# Patient Record
Sex: Male | Born: 1945 | Race: White | Hispanic: No | Marital: Married | Smoking: Former smoker
Health system: Southern US, Community
[De-identification: ages and names within clinical notes are randomized; demographics above are authoritative.]

## PROBLEM LIST (undated history)

## (undated) DIAGNOSIS — I4891 Unspecified atrial fibrillation: Secondary | ICD-10-CM

## (undated) DIAGNOSIS — Z7901 Long term (current) use of anticoagulants: Secondary | ICD-10-CM

## (undated) DIAGNOSIS — N529 Male erectile dysfunction, unspecified: Secondary | ICD-10-CM

## (undated) HISTORY — DX: Long term (current) use of anticoagulants: Z79.01

## (undated) HISTORY — DX: Unspecified atrial fibrillation: I48.91

## (undated) HISTORY — DX: Male erectile dysfunction, unspecified: N52.9

---

## 1984-08-18 HISTORY — PX: VASECTOMY REVERSAL: SHX243

## 1984-08-18 HISTORY — PX: CIRCUMCISION REVISION: SHX1347

## 1997-11-28 ENCOUNTER — Other Ambulatory Visit: Admission: RE | Admit: 1997-11-28 | Discharge: 1997-11-28 | Payer: Self-pay | Admitting: Family Medicine

## 2001-12-10 ENCOUNTER — Ambulatory Visit (HOSPITAL_COMMUNITY): Admission: RE | Admit: 2001-12-10 | Discharge: 2001-12-10 | Payer: Self-pay | Admitting: *Deleted

## 2004-02-28 ENCOUNTER — Emergency Department (HOSPITAL_COMMUNITY): Admission: EM | Admit: 2004-02-28 | Discharge: 2004-02-28 | Payer: Self-pay | Admitting: Emergency Medicine

## 2004-07-24 HISTORY — PX: US ECHOCARDIOGRAPHY: HXRAD669

## 2004-10-08 ENCOUNTER — Ambulatory Visit (HOSPITAL_COMMUNITY): Admission: RE | Admit: 2004-10-08 | Discharge: 2004-10-08 | Payer: Self-pay | Admitting: *Deleted

## 2005-10-15 HISTORY — PX: CARDIOVASCULAR STRESS TEST: SHX262

## 2005-12-12 IMAGING — CR DG CHEST 2V
2 series · 2 of 2 positions shown · non-contrast
Comparison: none

CLINICAL DATA: Preprocedure respiratory film. 
 CHEST - 2 VIEW, 10/08/04:

[view not recorded (1 of 2)]
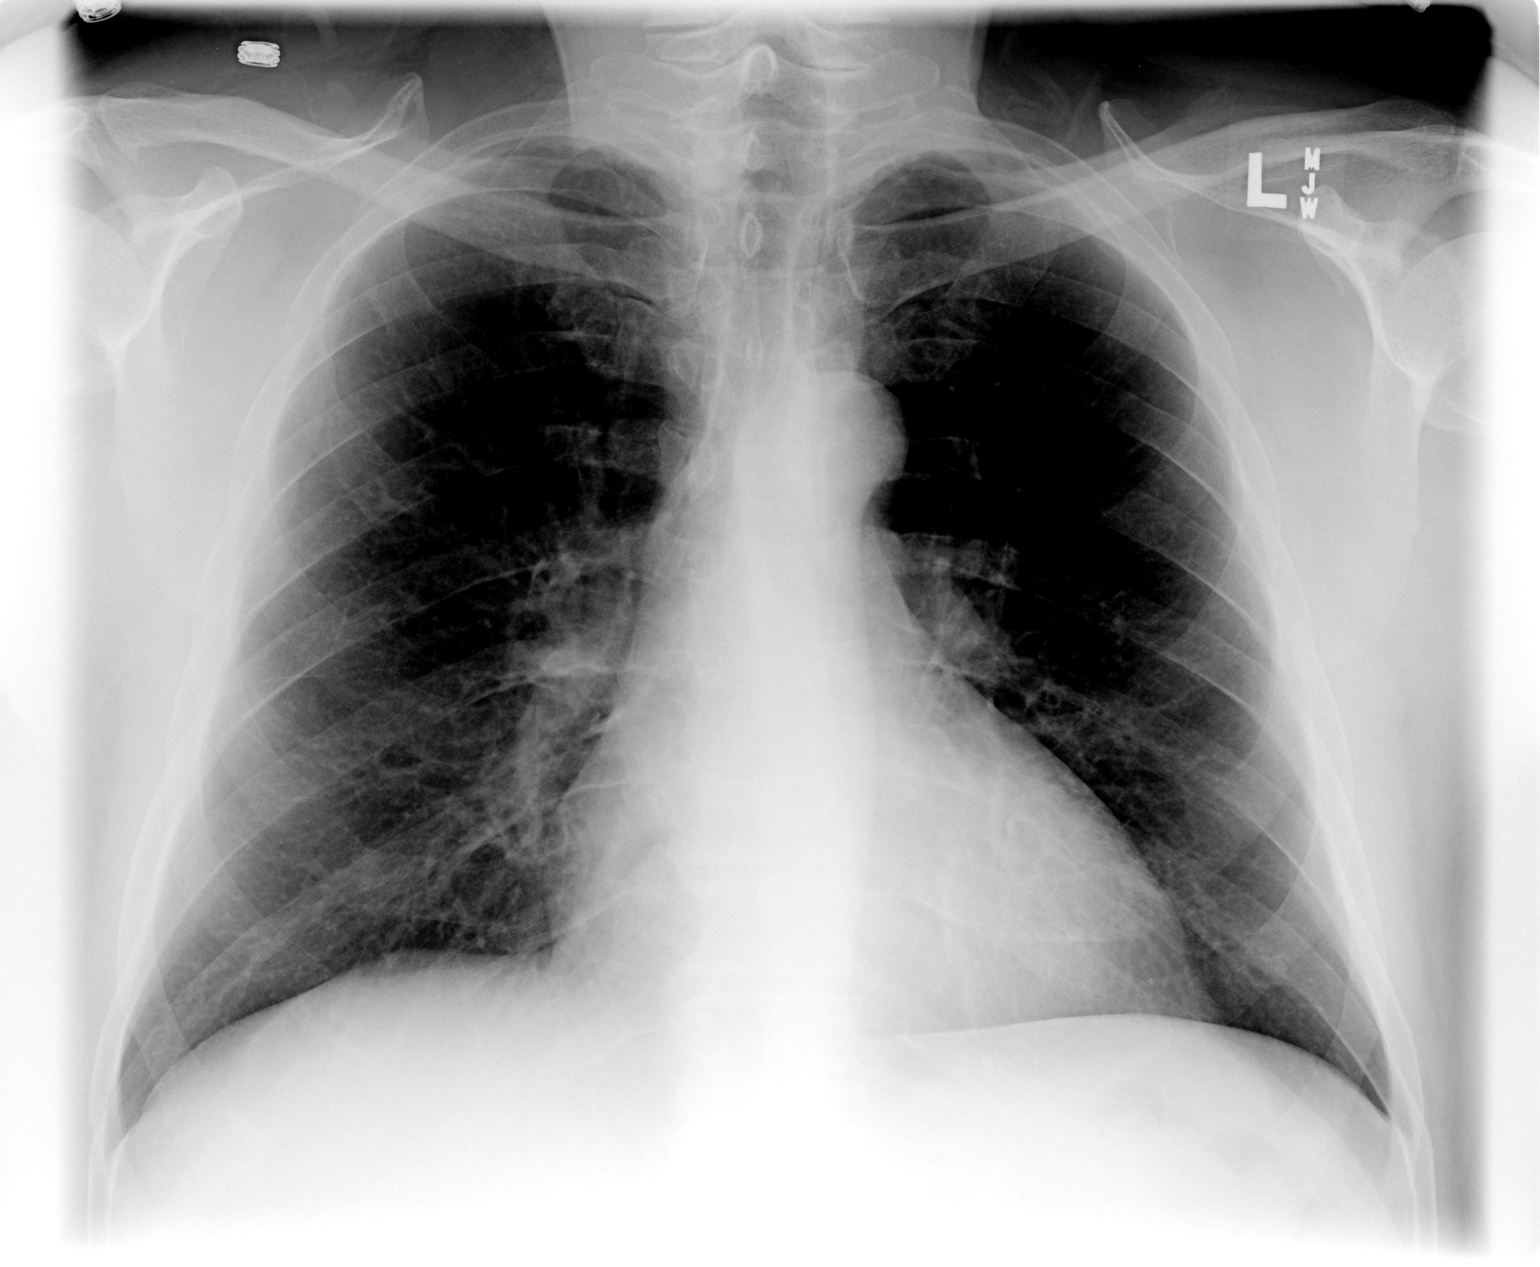

[view not recorded (2 of 2)]
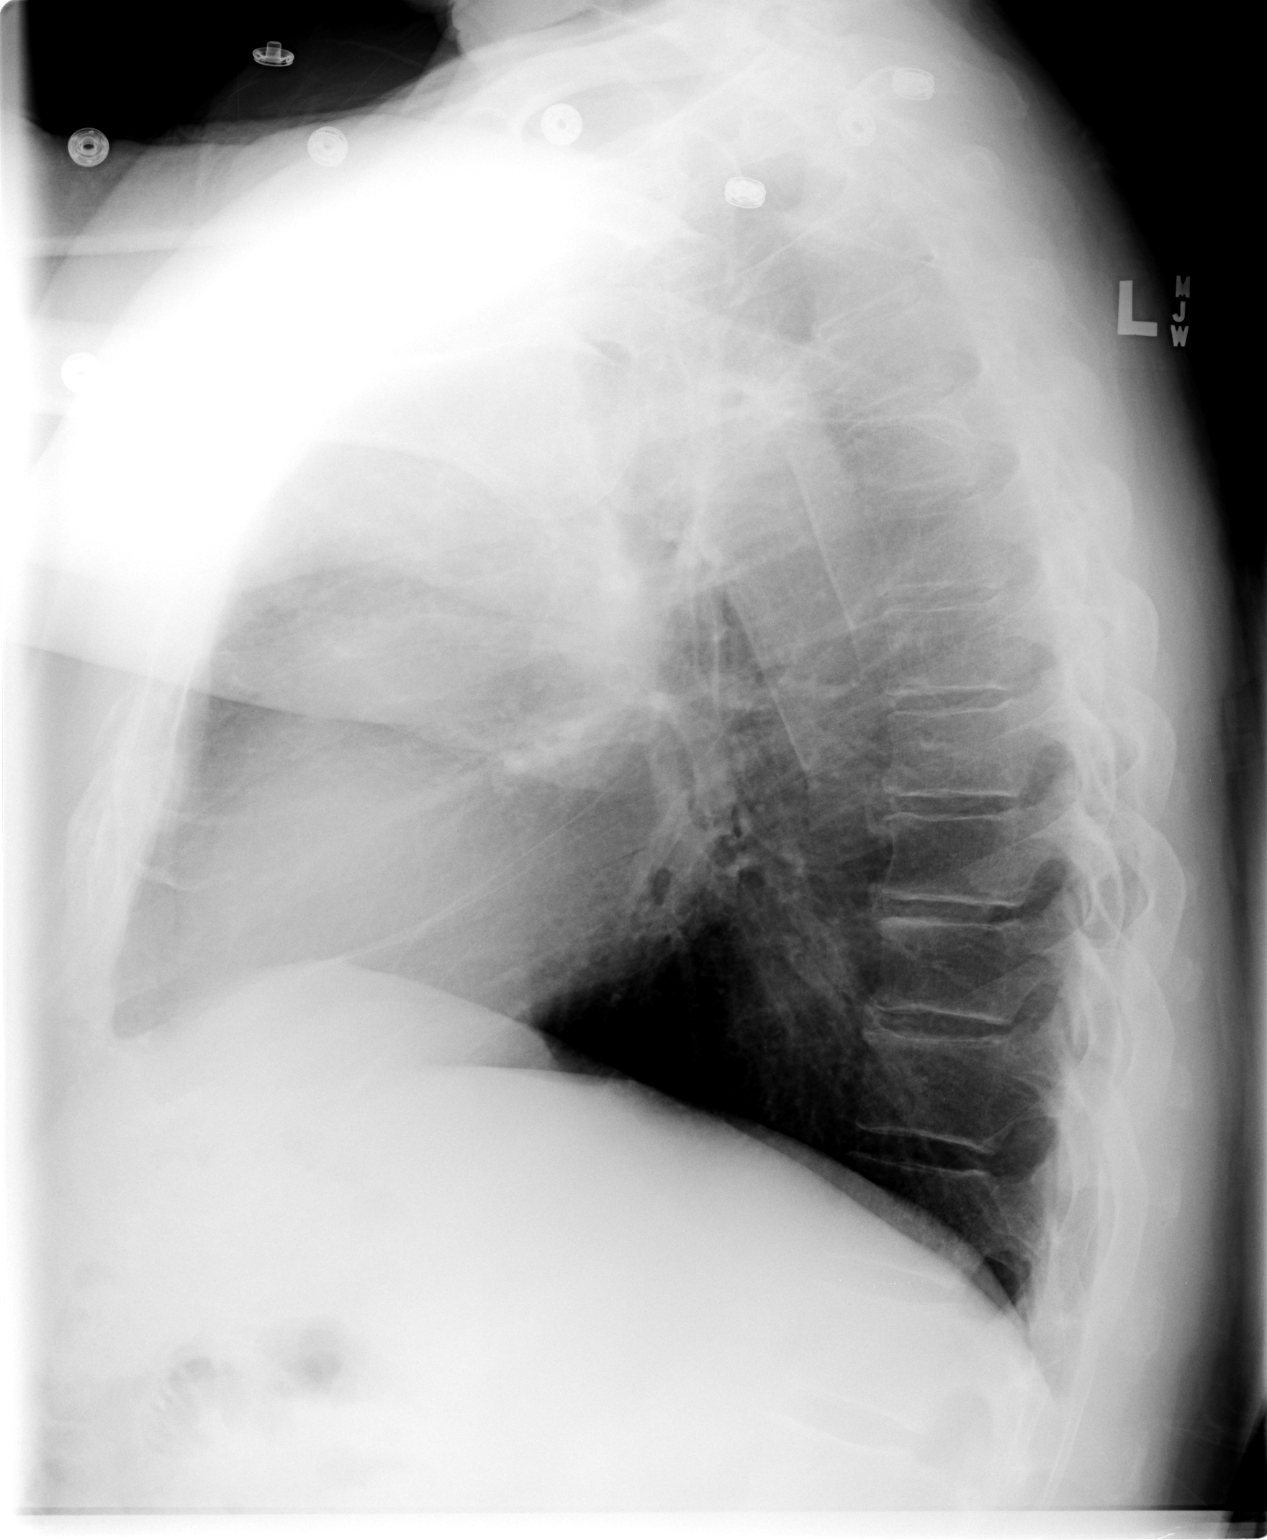

[2 of 2 positions shown; findings below may reference images not displayed]

FINDINGS: Lungs are clear.  Cardiomegaly.  No pleural effusion.  No focal bony abnormality.
IMPRESSION: Cardiomegaly without acute disease.

## 2006-01-27 ENCOUNTER — Ambulatory Visit (HOSPITAL_COMMUNITY): Admission: AD | Admit: 2006-01-27 | Discharge: 2006-01-27 | Payer: Self-pay | Admitting: Cardiovascular Disease

## 2010-03-25 ENCOUNTER — Ambulatory Visit: Payer: Self-pay | Admitting: Cardiovascular Disease

## 2010-04-25 ENCOUNTER — Ambulatory Visit: Payer: Self-pay | Admitting: Cardiovascular Disease

## 2010-05-21 ENCOUNTER — Ambulatory Visit: Payer: Self-pay | Admitting: Cardiovascular Disease

## 2010-06-25 ENCOUNTER — Ambulatory Visit: Payer: Self-pay | Admitting: Cardiovascular Disease

## 2010-07-26 ENCOUNTER — Ambulatory Visit: Payer: Self-pay | Admitting: Cardiovascular Disease

## 2010-09-02 ENCOUNTER — Ambulatory Visit: Payer: Self-pay | Admitting: Cardiology

## 2010-09-25 ENCOUNTER — Encounter (INDEPENDENT_AMBULATORY_CARE_PROVIDER_SITE_OTHER): Payer: BC Managed Care – PPO

## 2010-09-25 DIAGNOSIS — I4891 Unspecified atrial fibrillation: Secondary | ICD-10-CM

## 2010-09-25 DIAGNOSIS — Z7901 Long term (current) use of anticoagulants: Secondary | ICD-10-CM

## 2010-10-24 ENCOUNTER — Encounter (INDEPENDENT_AMBULATORY_CARE_PROVIDER_SITE_OTHER): Payer: BC Managed Care – PPO

## 2010-10-24 DIAGNOSIS — I4891 Unspecified atrial fibrillation: Secondary | ICD-10-CM

## 2010-10-24 DIAGNOSIS — Z7901 Long term (current) use of anticoagulants: Secondary | ICD-10-CM

## 2010-11-21 ENCOUNTER — Ambulatory Visit (INDEPENDENT_AMBULATORY_CARE_PROVIDER_SITE_OTHER): Payer: BC Managed Care – PPO | Admitting: *Deleted

## 2010-11-21 DIAGNOSIS — I4891 Unspecified atrial fibrillation: Secondary | ICD-10-CM

## 2010-11-25 ENCOUNTER — Encounter: Payer: BC Managed Care – PPO | Admitting: *Deleted

## 2010-12-19 ENCOUNTER — Encounter: Payer: BC Managed Care – PPO | Admitting: *Deleted

## 2010-12-20 ENCOUNTER — Telehealth: Payer: Self-pay | Admitting: *Deleted

## 2010-12-20 NOTE — Telephone Encounter (Signed)
Sent letter for missed coumadin app for 12/19/10.Alfonso Ramus RN

## 2010-12-30 ENCOUNTER — Other Ambulatory Visit: Payer: Self-pay | Admitting: *Deleted

## 2010-12-30 MED ORDER — WARFARIN SODIUM 5 MG PO TABS: ORAL_TABLET | ORAL | Status: DC

## 2010-12-31 ENCOUNTER — Ambulatory Visit (INDEPENDENT_AMBULATORY_CARE_PROVIDER_SITE_OTHER): Payer: BC Managed Care – PPO | Admitting: *Deleted

## 2010-12-31 DIAGNOSIS — I4891 Unspecified atrial fibrillation: Secondary | ICD-10-CM

## 2010-12-31 LAB — POCT INR: INR: 2.9

## 2011-01-28 ENCOUNTER — Ambulatory Visit (INDEPENDENT_AMBULATORY_CARE_PROVIDER_SITE_OTHER): Payer: BC Managed Care – PPO | Admitting: *Deleted

## 2011-01-28 DIAGNOSIS — I4891 Unspecified atrial fibrillation: Secondary | ICD-10-CM

## 2011-02-25 ENCOUNTER — Ambulatory Visit (INDEPENDENT_AMBULATORY_CARE_PROVIDER_SITE_OTHER): Payer: BC Managed Care – PPO | Admitting: *Deleted

## 2011-02-25 DIAGNOSIS — I4891 Unspecified atrial fibrillation: Secondary | ICD-10-CM

## 2011-03-03 ENCOUNTER — Other Ambulatory Visit: Payer: Self-pay | Admitting: *Deleted

## 2011-03-03 MED ORDER — METOPROLOL TARTRATE 50 MG PO TABS: 50.0000 mg | ORAL_TABLET | Freq: Two times a day (BID) | ORAL | Status: DC

## 2011-03-03 NOTE — Telephone Encounter (Signed)
Fax received from pharmacy. Refill completed. Jodette Mallory Schaad RN  

## 2011-03-13 ENCOUNTER — Other Ambulatory Visit: Payer: Self-pay | Admitting: *Deleted

## 2011-03-13 MED ORDER — VARDENAFIL HCL 10 MG PO TABS: 10.0000 mg | ORAL_TABLET | Freq: Every day | ORAL | Status: AC | PRN

## 2011-03-13 NOTE — Telephone Encounter (Signed)
Fax received from pharmacy. Refill completed. Jodette Ryshawn Sanzone RN  

## 2011-03-25 ENCOUNTER — Telehealth: Payer: Self-pay | Admitting: Cardiovascular Disease

## 2011-03-25 ENCOUNTER — Ambulatory Visit (INDEPENDENT_AMBULATORY_CARE_PROVIDER_SITE_OTHER): Payer: BC Managed Care – PPO | Admitting: *Deleted

## 2011-03-25 DIAGNOSIS — I4891 Unspecified atrial fibrillation: Secondary | ICD-10-CM

## 2011-03-25 LAB — POCT INR: INR: 2.5

## 2011-03-25 NOTE — Telephone Encounter (Signed)
Placed Chart on [facility] Desk to be picked up today for [employer]

## 2011-03-25 NOTE — Telephone Encounter (Signed)
Place Chart on [facility] desk to be picked up today with Auth attached

## 2011-03-25 NOTE — Telephone Encounter (Signed)
Arline Asp on behalf of [employer] called requesting last 5 years of records, states is faxing over [place] to release info today

## 2011-04-07 ENCOUNTER — Encounter: Payer: Self-pay | Admitting: Cardiovascular Disease

## 2011-04-16 ENCOUNTER — Ambulatory Visit (INDEPENDENT_AMBULATORY_CARE_PROVIDER_SITE_OTHER): Payer: BC Managed Care – PPO | Admitting: Cardiovascular Disease

## 2011-04-16 ENCOUNTER — Encounter: Payer: Self-pay | Admitting: Cardiovascular Disease

## 2011-04-16 VITALS — BP 114/84 | HR 84 | Ht 73.5 in | Wt 227.8 lb

## 2011-04-16 DIAGNOSIS — N529 Male erectile dysfunction, unspecified: Secondary | ICD-10-CM

## 2011-04-16 DIAGNOSIS — I4891 Unspecified atrial fibrillation: Secondary | ICD-10-CM

## 2011-04-16 MED ORDER — TADALAFIL 20 MG PO TABS: 20.0000 mg | ORAL_TABLET | Freq: Every day | ORAL | Status: DC | PRN

## 2011-04-16 NOTE — Assessment & Plan Note (Signed)
Will prescribe Cialis 20 mg to be taken prn.

## 2011-04-16 NOTE — Progress Notes (Signed)
Adam Parrish Date of Birth  Jul 01, 1946 [facility] [address]     [address] [place], [place]  [address] [phone]  Fax  [phone]  History of Present Illness:  65 year old gentleman with a history of chronic atrial fibrillation. He's done very well. He's not had any episodes of chest pain or shortness breath.  He has had some problems with ED that we effectively treated with Cialis 20 mg.   Current Outpatient Prescriptions on File Prior to Visit  Medication Sig Dispense Refill  . aspirin 81 MG tablet Take 81 mg by mouth daily.        Marland Kitchen doxazosin (CARDURA) 2 MG tablet Take 2 mg by mouth at bedtime.        . Methylsulfonylmethane (MSM PO) Take by mouth 4 (four) times daily.        . metoprolol (LOPRESSOR) 50 MG tablet Take 1 tablet (50 mg total) by mouth 2 (two) times daily.  60 tablet  4  . warfarin (COUMADIN) 5 MG tablet Take as directed  50 tablet  11    Allergies  Allergen Reactions  . Shellfish Allergy     Past Medical History  Diagnosis Date  . Atrial fibrillation     Chronic   . Chronic anticoagulation     followed at [facility]  . Erectile dysfunction     Past Surgical History  Procedure Date  . Vasectomy reversal 1986  . Circumcision revision 1986  . US echocardiography 07-24-2004    EF 55-60%  . Cardiovascular stress test 10-15-2005    EF 54%    History  Smoking status  . Never Smoker   Smokeless tobacco  . Not on file    History  Alcohol Use  . Yes    Occas.    Family History  Problem Relation Age of Onset  . Alzheimer's disease Mother   . Prostate cancer Father     Reviw of Systems:  Reviewed in the HPI.  All other systems are negative.  Physical Exam: BP 114/84  Pulse 84  Ht 6' 1.5" (1.867 m)  Wt 227 lb 12.8 oz (103.329 kg)  BMI 29.65 kg/m2  SpO2 95% The patient is alert and oriented x 3.  The mood and affect are normal.   Skin: warm and dry.  Color is normal.    HEENT:   the  sclera are nonicteric.  The mucous membranes are moist.  The carotids are 2+ without bruits.  There is no thyromegaly.  There is no JVD.    Lungs: clear.  The chest wall is non tender.    Heart: Irregular rate with a normal S1 and S2.  There are no murmurs, gallops, or rubs. The PMI is not displaced.     Abdomen: good bowel sounds.  There is no guarding or rebound.  There is no hepatosplenomegaly or tenderness.  There are no masses.   Extremities:  no clubbing, cyanosis, or edema.  The legs are without rashes.  The distal pulses are intact.   Neuro:  Cranial nerves II - XII are intact.  Motor and sensory functions are intact.    The gait is normal.   Assessment / Plan:

## 2011-04-16 NOTE — Assessment & Plan Note (Addendum)
His atrial fibrillation remains very stable. He has not had any episodes of chest pain or shortness of breath.

## 2011-04-17 ENCOUNTER — Encounter: Payer: Self-pay | Admitting: Cardiovascular Disease

## 2011-04-22 ENCOUNTER — Encounter: Payer: BC Managed Care – PPO | Admitting: *Deleted

## 2011-06-02 ENCOUNTER — Telehealth: Payer: Self-pay

## 2011-06-02 NOTE — Telephone Encounter (Signed)
Pt missed his last Coumadin appt in September.  Last INR in Aug 2.5, pt woke up Saturday am with busted blood vessel in his eye.  Pt went to emergency walk in clinic at pt's primary MD, INR checked at OV 2.9.  Dr Luciana Axe advised pt INR WNL, vision not affected, and will resolve on it's on over the next couple of weeks. Pt's wife concerned Friday they are going out of town to [employer] football game and wanted to know if pt needed Coumadin checked prior to.  Advised pt has been very steady on this dosage of 5mg  daily, last INR WNL same dosage since April.  Advised to continue on same dosage and made 4 week f/u on pt's day off of work per wife will work best.  Pts wife states she will stay on him to keep his scheduled f/u.

## 2011-07-04 ENCOUNTER — Ambulatory Visit (INDEPENDENT_AMBULATORY_CARE_PROVIDER_SITE_OTHER): Payer: BC Managed Care – PPO | Admitting: *Deleted

## 2011-07-04 DIAGNOSIS — I4891 Unspecified atrial fibrillation: Secondary | ICD-10-CM

## 2011-07-04 LAB — POCT INR: INR: 3.2

## 2011-07-08 ENCOUNTER — Telehealth: Payer: Self-pay | Admitting: Cardiovascular Disease

## 2011-07-08 MED ORDER — TADALAFIL 5 MG PO TABS: ORAL_TABLET | ORAL | Status: DC

## 2011-07-08 NOTE — Telephone Encounter (Signed)
Pt had written script for cialis 20mg  Tablet from dr Elease Hashimoto but to get discount needs 5 mg tablet and #30, order sent.

## 2011-07-08 NOTE — Telephone Encounter (Signed)
New problem Pt's wife is calling about cialis prescription She need a rx for 30 pills so she can use a discount voucher Please call

## 2011-07-25 ENCOUNTER — Encounter: Payer: BC Managed Care – PPO | Admitting: *Deleted

## 2011-07-31 ENCOUNTER — Other Ambulatory Visit: Payer: Self-pay | Admitting: *Deleted

## 2011-07-31 MED ORDER — METOPROLOL TARTRATE 50 MG PO TABS: 50.0000 mg | ORAL_TABLET | Freq: Two times a day (BID) | ORAL | Status: DC

## 2011-08-01 ENCOUNTER — Ambulatory Visit (INDEPENDENT_AMBULATORY_CARE_PROVIDER_SITE_OTHER): Payer: BC Managed Care – PPO | Admitting: *Deleted

## 2011-08-01 DIAGNOSIS — I4891 Unspecified atrial fibrillation: Secondary | ICD-10-CM

## 2011-08-01 LAB — POCT INR: INR: 2.2

## 2011-08-29 ENCOUNTER — Ambulatory Visit (INDEPENDENT_AMBULATORY_CARE_PROVIDER_SITE_OTHER): Payer: BC Managed Care – PPO | Admitting: *Deleted

## 2011-08-29 DIAGNOSIS — I4891 Unspecified atrial fibrillation: Secondary | ICD-10-CM

## 2011-08-29 LAB — POCT INR: INR: 2.9

## 2011-09-26 ENCOUNTER — Ambulatory Visit (INDEPENDENT_AMBULATORY_CARE_PROVIDER_SITE_OTHER): Payer: BC Managed Care – PPO | Admitting: *Deleted

## 2011-09-26 DIAGNOSIS — I4891 Unspecified atrial fibrillation: Secondary | ICD-10-CM

## 2011-10-06 ENCOUNTER — Ambulatory Visit (INDEPENDENT_AMBULATORY_CARE_PROVIDER_SITE_OTHER): Payer: BC Managed Care – PPO | Admitting: Cardiovascular Disease

## 2011-10-06 ENCOUNTER — Encounter: Payer: Self-pay | Admitting: Cardiovascular Disease

## 2011-10-06 VITALS — BP 111/83 | HR 75 | Ht 74.0 in | Wt 233.0 lb

## 2011-10-06 DIAGNOSIS — I4891 Unspecified atrial fibrillation: Secondary | ICD-10-CM

## 2011-10-06 NOTE — Patient Instructions (Addendum)
Your physician wants you to follow-up in: 6 months.   with an ekg that day.   You will receive a reminder letter in the mail two months in advance. If you don't receive a letter, please call our office to schedule the follow-up appointment.  

## 2011-10-06 NOTE — Assessment & Plan Note (Signed)
Adam Parrish is doing very well. He continues to have chronic atrial fibrillation. We'll continue with rate control and anticoagulation. I'll see him again in 6 months.

## 2011-10-06 NOTE — Progress Notes (Signed)
Adam Parrish Date of Birth  1946-02-22 [facility]     [place] Office  [address]    [address]   [address] [place], [place]  [address]    [place], [place]  [address] [phone]  Fax  [phone]  [phone]  Fax [phone]  Problem LIst: 1. Atrial Fibrillation 2.  Chronic anticoagulation   History of Present Illness:  Adam Parrish is a 66 yo with chronic A-Fib.  He has done well.  He is exercising regularly.  Current Outpatient Prescriptions on File Prior to Visit  Medication Sig Dispense Refill  . aspirin 81 MG tablet Take 81 mg by mouth daily.        . Methylsulfonylmethane (MSM PO) Take by mouth 4 (four) times daily.        . metoprolol (LOPRESSOR) 50 MG tablet Take 1 tablet (50 mg total) by mouth 2 (two) times daily.  60 tablet  4  . Saw Palmetto, Serenoa repens, (SAW PALMETTO PO) Take 1 tablet by mouth 3 (three) times daily.       . vitamin E 400 UNIT capsule Take 400 Units by mouth daily.        Marland Kitchen warfarin (COUMADIN) 5 MG tablet Take as directed  50 tablet  11  . B Complex Vitamins (B COMPLEX PO) Take by mouth.        . Cholecalciferol (VITAMIN D PO) Take by mouth.        . doxazosin (CARDURA) 2 MG tablet Take 2 mg by mouth at bedtime.        . tadalafil (CIALIS) 5 MG tablet One to two tablets (5 mg to 10 mg ) daily prn  30 tablet  0    Allergies  Allergen Reactions  . Shellfish Allergy     Past Medical History  Diagnosis Date  . Atrial fibrillation     Chronic   . Chronic anticoagulation     followed at [facility]  . Erectile dysfunction     Past Surgical History  Procedure Date  . Vasectomy reversal 1986  . Circumcision revision 1986  . US echocardiography 07-24-2004    EF 55-60%  . Cardiovascular stress test 10-15-2005    EF 54%    History  Smoking status  . Never Smoker   Smokeless tobacco  . Not on file    History  Alcohol Use  . Yes    Occas.    Family History  Problem Relation Age of Onset  . Alzheimer's  disease Mother   . Prostate cancer Father     Reviw of Systems:  Reviewed in the HPI.  All other systems are negative.  Physical Exam: Blood pressure 111/83, pulse 75, height 6\' 2"  (1.88 m), weight 233 lb (105.688 kg). General: Well developed, well nourished, in no acute distress.  Head: Normocephalic, atraumatic, sclera non-icteric, mucus membranes are moist,   Neck: Supple. Negative for carotid bruits. JVD not elevated.  Lungs: Clear bilaterally to auscultation without wheezes, rales, or rhonchi. Breathing is unlabored.  Heart: irregularly irregular.  with S1 S2. No murmurs, rubs, or gallops appreciated.  Abdomen: Soft, non-tender, non-distended with normoactive bowel sounds. No hepatomegaly. No rebound/guarding. No obvious abdominal masses.  Msk:  Strength and tone appear normal for age.  Extremities: No clubbing or cyanosis. No edema.  Distal pedal pulses are 2+ and equal bilaterally.  Neuro: Alert and oriented X 3. Moves all extremities spontaneously.  Psych:  Responds to questions appropriately with a  normal affect.  ECG:   Assessment / Plan:

## 2011-11-04 ENCOUNTER — Ambulatory Visit (INDEPENDENT_AMBULATORY_CARE_PROVIDER_SITE_OTHER): Payer: BC Managed Care – PPO | Admitting: *Deleted

## 2011-11-04 DIAGNOSIS — I4891 Unspecified atrial fibrillation: Secondary | ICD-10-CM

## 2011-11-04 LAB — POCT INR: INR: 3

## 2011-12-19 ENCOUNTER — Ambulatory Visit (INDEPENDENT_AMBULATORY_CARE_PROVIDER_SITE_OTHER): Payer: Medicare Other | Admitting: *Deleted

## 2011-12-19 DIAGNOSIS — I4891 Unspecified atrial fibrillation: Secondary | ICD-10-CM

## 2011-12-19 LAB — POCT INR: INR: 2.1

## 2011-12-25 ENCOUNTER — Other Ambulatory Visit: Payer: Self-pay | Admitting: *Deleted

## 2011-12-25 NOTE — Telephone Encounter (Signed)
New pharm called [place] asking for refill for metoprolol, done by phone.

## 2012-01-26 ENCOUNTER — Other Ambulatory Visit: Payer: Self-pay

## 2012-01-26 MED ORDER — WARFARIN SODIUM 5 MG PO TABS: ORAL_TABLET | ORAL | Status: DC

## 2012-01-27 ENCOUNTER — Ambulatory Visit (INDEPENDENT_AMBULATORY_CARE_PROVIDER_SITE_OTHER): Payer: Medicare Other

## 2012-01-27 DIAGNOSIS — I4891 Unspecified atrial fibrillation: Secondary | ICD-10-CM

## 2012-01-27 LAB — POCT INR: INR: 2.9

## 2012-03-10 ENCOUNTER — Ambulatory Visit (INDEPENDENT_AMBULATORY_CARE_PROVIDER_SITE_OTHER): Payer: Medicare Other | Admitting: *Deleted

## 2012-03-10 DIAGNOSIS — I4891 Unspecified atrial fibrillation: Secondary | ICD-10-CM

## 2012-03-10 LAB — POCT INR: INR: 2.4

## 2012-04-14 ENCOUNTER — Encounter: Payer: Self-pay | Admitting: Cardiovascular Disease

## 2012-04-15 ENCOUNTER — Ambulatory Visit (INDEPENDENT_AMBULATORY_CARE_PROVIDER_SITE_OTHER): Payer: Medicare Other

## 2012-04-15 DIAGNOSIS — I4891 Unspecified atrial fibrillation: Secondary | ICD-10-CM

## 2012-04-15 LAB — POCT INR: INR: 2.1

## 2012-05-07 ENCOUNTER — Ambulatory Visit (INDEPENDENT_AMBULATORY_CARE_PROVIDER_SITE_OTHER): Payer: Medicare Other | Admitting: Cardiovascular Disease

## 2012-05-07 ENCOUNTER — Encounter: Payer: Self-pay | Admitting: Cardiovascular Disease

## 2012-05-07 VITALS — BP 118/80 | HR 66 | Ht 72.5 in | Wt 224.0 lb

## 2012-05-07 DIAGNOSIS — I4891 Unspecified atrial fibrillation: Secondary | ICD-10-CM

## 2012-05-07 NOTE — Patient Instructions (Addendum)
Your physician wants you to follow-up in: 6 months  You will receive a reminder letter in the mail two months in advance. If you don't receive a letter, please call our office to schedule the follow-up appointment.  Your physician recommends that you return for a FASTING lipid profile: 6 months   

## 2012-05-07 NOTE — Assessment & Plan Note (Signed)
Adam Parrish is doing well. We'll continue the same medications. Encouraged him to exercise on a regular basis.

## 2012-05-07 NOTE — Progress Notes (Signed)
Adam Parrish Date of Birth  1946-03-25 [facility]     [place] Office  [address]    [address]   [address] [place], [place]  [address]    [place], [place]  [address] [phone]  Fax  [phone]  [phone]  Fax [phone]  Problem LIst: 1. Atrial Fibrillation 2.  Chronic anticoagulation   History of Present Illness:  Adam Parrish is a 66 yo with chronic A-Fib.  He has done well.  He is exercising regularly.  He has not had any problems with syncope, presyncope  Current Outpatient Prescriptions on File Prior to Visit  Medication Sig Dispense Refill  . Aloe Vera Leaf POWD 1 packet by Does not apply route daily.      Marland Kitchen aspirin 81 MG tablet Take 81 mg by mouth daily.        . B Complex Vitamins (B COMPLEX PO) Take by mouth.        Marland Kitchen Bioflavonoid Products (ESTER C PO) Take 1 tablet by mouth daily.      . Cholecalciferol (VITAMIN D PO) Take by mouth.        . DEVILS CLAW PO Take 1 tablet by mouth daily.      . fish oil-omega-3 fatty acids 1000 MG capsule Take 2 g by mouth 3 (three) times daily. Hylastin Omega 3      . glucosamine-chondroitin 500-400 MG tablet Take 1 tablet by mouth 2 (two) times daily.      . Methylsulfonylmethane (MSM PO) Take by mouth 4 (four) times daily.        . metoprolol (LOPRESSOR) 50 MG tablet Take 1 tablet (50 mg total) by mouth 2 (two) times daily.  60 tablet  4  . Multiple Vitamin (CALCIUM COMPLEX PO) Take 1 tablet by mouth daily.      . Multiple Vitamins-Minerals (MULTIVITAMIN WITH MINERALS) tablet Take 1 tablet by mouth daily.      . NON FORMULARY Take 1 tablet by mouth 2 (two) times daily. Vitamin c plus d      . NON FORMULARY Take 1 tablet by mouth 3 (three) times daily. wobenzym N      . NON FORMULARY Take 1 tablet by mouth daily. Vitamin B-125 complex      . NONFORMULARY OR COMPOUNDED ITEM Take 1 each by mouth daily. Joint care with glucosamine, msm and chondroitin      . Nutritional Supplements (JUICE PLUS FIBRE PO) Take 1  tablet by mouth 2 (two) times daily. Juice plus orchard blend      . Nutritional Supplements (JUICE PLUS FIBRE PO) Take 1 tablet by mouth 2 (two) times daily. Juice Plus Garden blend      . Nutritional Supplements (JUICE PLUS FIBRE PO) Take 1 tablet by mouth 2 (two) times daily. Juice Plus [employer]      . Potassium 99 MG TABS Take 1 tablet by mouth daily.      . Saw Palmetto, Serenoa repens, (SAW PALMETTO PO) Take 1 tablet by mouth 3 (three) times daily.       . tadalafil (CIALIS) 5 MG tablet One to two tablets (5 mg to 10 mg ) daily prn  30 tablet  0  . vitamin E 400 UNIT capsule Take 400 Units by mouth daily.        Marland Kitchen warfarin (COUMADIN) 5 MG tablet Take as directed  50 tablet  3  . tadalafil (CIALIS) 20 MG tablet Take 1 tablet (20 mg  total) by mouth daily as needed for erectile dysfunction.  15 tablet  0    Allergies  Allergen Reactions  . Shellfish Allergy     Past Medical History  Diagnosis Date  . Atrial fibrillation     Chronic   . Chronic anticoagulation     followed at [facility]  . Erectile dysfunction     Past Surgical History  Procedure Date  . Vasectomy reversal 1986  . Circumcision revision 1986  . US echocardiography 07-24-2004    EF 55-60%  . Cardiovascular stress test 10-15-2005    EF 54%    History  Smoking status  . Never Smoker   Smokeless tobacco  . Not on file    History  Alcohol Use  . Yes    Occas.    Family History  Problem Relation Age of Onset  . Alzheimer's disease Mother   . Prostate cancer Father     Reviw of Systems:  Reviewed in the HPI.  All other systems are negative.  Physical Exam: Blood pressure 118/80, pulse 66, height 6' 0.5" (1.842 m), weight 224 lb (101.606 kg). General: Well developed, well nourished, in no acute distress.  Head: Normocephalic, atraumatic, sclera non-icteric, mucus membranes are moist,   Neck: Supple. Negative for carotid bruits. JVD not elevated.  Lungs: Clear bilaterally to auscultation  without wheezes, rales, or rhonchi. Breathing is unlabored.  Heart: irregularly irregular.  with S1 S2. No murmurs, rubs, or gallops appreciated.  Abdomen: Soft, non-tender, non-distended with normoactive bowel sounds. No hepatomegaly. No rebound/guarding. No obvious abdominal masses.  Msk:  Strength and tone appear normal for age.  Extremities: No clubbing or cyanosis. No edema.  Distal pedal pulses are 2+ and equal bilaterally.  Neuro: Alert and oriented X 3. Moves all extremities spontaneously.  Psych:  Responds to questions appropriately with a normal affect.  ECG:    Assessment / Plan:

## 2012-05-11 ENCOUNTER — Telehealth: Payer: Self-pay | Admitting: *Deleted

## 2012-05-11 NOTE — Telephone Encounter (Signed)
Received request from [facility] to hold coumadin for colonoscopy 07/08/12, will address with dr Elease Hashimoto.

## 2012-05-12 ENCOUNTER — Encounter: Payer: Self-pay | Admitting: Cardiovascular Disease

## 2012-05-13 ENCOUNTER — Encounter: Payer: Self-pay | Admitting: Cardiovascular Disease

## 2012-05-14 NOTE — Telephone Encounter (Signed)
It will be OK for Adam Parrish to hold his coumadin for 5 days prior to colonoscopy.

## 2012-05-17 ENCOUNTER — Encounter: Payer: Self-pay | Admitting: *Deleted

## 2012-05-17 NOTE — Telephone Encounter (Signed)
Faxed letter to [facility] [phone]

## 2012-05-25 ENCOUNTER — Other Ambulatory Visit: Payer: Self-pay | Admitting: Cardiovascular Disease

## 2012-05-25 NOTE — Telephone Encounter (Signed)
Fax Received. Refill Completed. Burgess Sheriff Chowoe (R.M.A)   

## 2012-10-25 ENCOUNTER — Telehealth: Payer: Self-pay | Admitting: *Deleted

## 2012-10-25 MED ORDER — METOPROLOL TARTRATE 50 MG PO TABS: 50.0000 mg | ORAL_TABLET | Freq: Two times a day (BID) | ORAL | Status: DC

## 2012-10-25 NOTE — Telephone Encounter (Signed)
Fax Received. Refill Completed. Adam Parrish (R.M.A)   

## 2013-02-02 ENCOUNTER — Other Ambulatory Visit: Payer: Self-pay | Admitting: *Deleted

## 2013-02-02 MED ORDER — TADALAFIL 5 MG PO TABS: ORAL_TABLET | ORAL | Status: DC

## 2013-02-02 NOTE — Telephone Encounter (Signed)
Pharm request refill/ refill completed

## 2013-02-23 ENCOUNTER — Other Ambulatory Visit: Payer: Self-pay | Admitting: *Deleted

## 2013-02-23 MED ORDER — METOPROLOL TARTRATE 50 MG PO TABS: 50.0000 mg | ORAL_TABLET | Freq: Two times a day (BID) | ORAL | Status: DC

## 2013-02-23 NOTE — Telephone Encounter (Signed)
Fax Received. Refill Completed. Allyana Vogan Chowoe (R.M.A)   

## 2013-05-04 ENCOUNTER — Telehealth: Payer: Self-pay

## 2013-05-04 NOTE — Telephone Encounter (Signed)
Dr Catha Gosselin pt's PCP is now managing and monitoring pt's Coumadin. Last seen in [facility] 04/15/12.  Refill request needs to go to Dr Fredirick Maudlin office. We cannot refill because we no longer manage pt's Coumadin.  LMOM TCB.

## 2013-05-05 NOTE — Telephone Encounter (Signed)
Spoke with pharmacy.  They will contact Dr. Clarene Duke for refills.

## 2013-06-24 ENCOUNTER — Other Ambulatory Visit: Payer: Self-pay

## 2013-06-24 MED ORDER — METOPROLOL TARTRATE 50 MG PO TABS: 50.0000 mg | ORAL_TABLET | Freq: Two times a day (BID) | ORAL | Status: DC

## 2013-07-01 ENCOUNTER — Other Ambulatory Visit: Payer: Self-pay | Admitting: *Deleted

## 2013-07-01 MED ORDER — METOPROLOL TARTRATE 50 MG PO TABS: 50.0000 mg | ORAL_TABLET | Freq: Two times a day (BID) | ORAL | Status: DC

## 2013-08-09 ENCOUNTER — Encounter: Payer: Self-pay | Admitting: Cardiovascular Disease

## 2013-08-09 ENCOUNTER — Ambulatory Visit (INDEPENDENT_AMBULATORY_CARE_PROVIDER_SITE_OTHER): Payer: Medicare Other | Admitting: Cardiovascular Disease

## 2013-08-09 VITALS — BP 98/50 | HR 84 | Ht 72.5 in | Wt 231.8 lb

## 2013-08-09 DIAGNOSIS — I4891 Unspecified atrial fibrillation: Secondary | ICD-10-CM

## 2013-08-09 NOTE — Patient Instructions (Signed)
Your physician wants you to follow-up in: 1 YEAR WITH EKG  You will receive a reminder letter in the mail two months in advance. If you don't receive a letter, please call our office to schedule the follow-up appointment.   Your physician recommends that you continue on your current medications as directed. Please refer to the Current Medication list given to you today.  

## 2013-08-09 NOTE — Assessment & Plan Note (Signed)
Adam Parrish is very stable. His atrial fibrillation rate is normal. He continues on chronic Coumadin therapy. We'll continue with the same medications. I'll see him again in one year.

## 2013-08-09 NOTE — Progress Notes (Signed)
Adam Parrish Date of Birth  [birth year] [facility]     [place] Office  [address]    [address]   [address] [place], [place]  [address]    [place], [place]  [address] [phone]  Fax  [phone]  [phone]  Fax [phone]  Problem LIst: 1. Atrial Fibrillation 2.  Chronic anticoagulation   History of Present Illness:  Adam Parrish is a 67 yo with chronic A-Fib.  He has done well.  He is exercising regularly.  He has not had any problems with syncope, presyncope  Dec. 23, 2014:  Adam Parrish is doing well.  Exercising regularly.  No CP or dyspnea.  Stable AF with well-controlled ventricular response. He remains on Coumadin.  He has his Coumadin levels checked through Dr. Fredirick Maudlin office. His INR levels are well controlled.  Current Outpatient Prescriptions on File Prior to Visit  Medication Sig Dispense Refill  . aspirin 81 MG tablet Take 81 mg by mouth daily.        . B Complex Vitamins (B COMPLEX PO) Take by mouth.        Marland Kitchen Bioflavonoid Products (ESTER C PO) Take 1 tablet by mouth daily.      . Cholecalciferol (VITAMIN D PO) Take by mouth.        . DEVILS CLAW PO Take 1 tablet by mouth daily.      . fish oil-omega-3 fatty acids 1000 MG capsule Take 2 g by mouth 3 (three) times daily. Hylastin Omega 3      . glucosamine-chondroitin 500-400 MG tablet Take 1 tablet by mouth 2 (two) times daily.      . Methylsulfonylmethane (MSM PO) Take by mouth 4 (four) times daily.        . metoprolol (LOPRESSOR) 50 MG tablet Take 1 tablet (50 mg total) by mouth 2 (two) times daily.  60 tablet  2  . Multiple Vitamin (CALCIUM COMPLEX PO) Take 1 tablet by mouth daily.      . Multiple Vitamins-Minerals (MULTIVITAMIN WITH MINERALS) tablet Take 1 tablet by mouth daily.      . NON FORMULARY Take 1 tablet by mouth 2 (two) times daily. Vitamin c plus d      . NON FORMULARY Take 1 tablet by mouth 3 (three) times daily. wobenzym N      . NON FORMULARY Take 1 tablet by mouth daily. Vitamin  B-125 complex      . NONFORMULARY OR COMPOUNDED ITEM Take 1 each by mouth daily. Joint care with glucosamine, msm and chondroitin      . Nutritional Supplements (JUICE PLUS FIBRE PO) Take 1 tablet by mouth 2 (two) times daily. Juice plus orchard blend      . Nutritional Supplements (JUICE PLUS FIBRE PO) Take 1 tablet by mouth 2 (two) times daily. Juice Plus Garden blend      . Nutritional Supplements (JUICE PLUS FIBRE PO) Take 1 tablet by mouth 2 (two) times daily. Juice Plus [employer]      . Potassium 99 MG TABS Take 1 tablet by mouth daily.      . Saw Palmetto, Serenoa repens, (SAW PALMETTO PO) Take 1 tablet by mouth 3 (three) times daily.       . tadalafil (CIALIS) 20 MG tablet Take 1 tablet (20 mg total) by mouth daily as needed for erectile dysfunction.  15 tablet  0  . tadalafil (CIALIS) 5 MG tablet One to two tablets (5 mg to 10 mg )  daily prn  30 tablet  3  . vitamin E 400 UNIT capsule Take 400 Units by mouth daily.        Marland Kitchen warfarin (COUMADIN) 5 MG tablet Take as directed  50 tablet  3   No current facility-administered medications on file prior to visit.    Allergies  Allergen Reactions  . Shellfish Allergy     Past Medical History  Diagnosis Date  . Atrial fibrillation     Chronic   . Chronic anticoagulation     follow[facility]  . Erectile dysfunction     Past Surgical History  Procedure Laterality Date  . Vasectomy reversal  1986  . Circumcision revision  1986  . US echocardiography  07-24-2004    EF 55-60%  . Cardiovascular stress test  10-15-2005    EF 54%    History  Smoking status  . Never Smoker   Smokeless tobacco  . Not on file    History  Alcohol Use  . Yes    Comment: Occas.    Family History  Problem Relation Age of Onset  . Alzheimer's disease Mother   . Prostate cancer Father     Reviw of Systems:  Reviewed in the HPI.  All other systems are negative.  Physical Exam: Blood pressure 98/50, pulse 84, height 6' 0.5" (1.842  m), weight 231 lb 12.8 oz (105.144 kg). General: Well developed, well nourished, in no acute distress.  Head: Normocephalic, atraumatic, sclera non-icteric, mucus membranes are moist,   Neck: Supple. Negative for carotid bruits. JVD not elevated.  Lungs: Clear bilaterally to auscultation without wheezes, rales, or rhonchi. Breathing- normal.  Heart: irregularly irregular.  with S1 S2. No murmurs, rubs, or gallops    Abdomen: Soft, non-tender, non-distended with normoactive bowel sounds. No hepatomegaly. No rebound/guarding. No obvious abdominal masses.  Msk:    normal for age.  Extremities: No clubbing or cyanosis. No edema.  Distal pedal pulses are 2+    Neuro: Alert and oriented X 3. Moves all extremities spontaneously.  Psych:  Responds to questions appropriately with a normal affect.  ECG:  08/09/2013: Atrial fibrillation with a rate of 84 beats a minute. He has occasional premature ventricular contractions. He has no ST or T wave changes.  Assessment / Plan:

## 2013-08-29 ENCOUNTER — Ambulatory Visit: Payer: Medicare Other | Admitting: Cardiovascular Disease

## 2013-09-23 ENCOUNTER — Other Ambulatory Visit: Payer: Self-pay

## 2013-09-23 MED ORDER — METOPROLOL TARTRATE 50 MG PO TABS: 50.0000 mg | ORAL_TABLET | Freq: Two times a day (BID) | ORAL | Status: DC

## 2013-12-19 ENCOUNTER — Other Ambulatory Visit: Payer: Self-pay | Admitting: Cardiovascular Disease

## 2014-02-02 ENCOUNTER — Telehealth: Payer: Self-pay | Admitting: Cardiovascular Disease

## 2014-02-02 NOTE — Telephone Encounter (Signed)
Patients wife called and is upset that no one has called her back. She sent a message through my chart. Her husbands BP is up and she would like a call back. Please call.

## 2014-02-02 NOTE — Telephone Encounter (Signed)
Spoke with pt wife, explained if she has an issue that requires immediate attention, my chart is not the best option. She should call. Patient wife voiced understanding.  For the last two days the pt diastolic bp has been running 102-90. The only change is the pt has started eating deli meat sandwiches. Explained deli meat contains a lot of sodium and that maybe why the bp is elevated. Pt wife is going to stop the deli meat and keep track of his bp. If not trending down by Monday she will let us know. He has no complaints, is working in the heat. Explained the pt needs to make sure he keeps hydrated. She voiced understanding and agreed with this plan. Will make dr Acie Fredrickson aware

## 2014-06-23 ENCOUNTER — Other Ambulatory Visit: Payer: Self-pay

## 2014-06-23 ENCOUNTER — Other Ambulatory Visit: Payer: Self-pay | Admitting: *Deleted

## 2014-06-23 ENCOUNTER — Telehealth: Payer: Self-pay

## 2014-06-23 MED ORDER — TADALAFIL 2.5 MG PO TABS: 2.5000 mg | ORAL_TABLET | ORAL | Status: DC | PRN

## 2014-06-23 NOTE — Telephone Encounter (Signed)
Patients wife wants to know if he can get an rx for the 2.5mg  cialis as she has a coupon for a free bottle of #20 vs having to pay $20 for one pill of the 20mg . Please advise. Thanks, MI

## 2014-06-23 NOTE — Telephone Encounter (Signed)
Patient has 20 and 5 mg on chart

## 2014-06-23 NOTE — Telephone Encounter (Signed)
cialis 20 mg. Same dose that he was on previously

## 2014-06-23 NOTE — Telephone Encounter (Signed)
Ok to refill Cialis? 

## 2014-06-23 NOTE — Telephone Encounter (Signed)
That would be fine Cialis 2.5 mg # 20 ( patient has coupon)  He should get further refills from his medical doctor

## 2014-06-23 NOTE — Telephone Encounter (Signed)
Rx sent. Patients wife aware that further refills should come from pcp.

## 2014-07-27 ENCOUNTER — Other Ambulatory Visit: Payer: Self-pay | Admitting: Cardiovascular Disease

## 2014-08-03 ENCOUNTER — Encounter: Payer: Self-pay | Admitting: Cardiovascular Disease

## 2014-08-03 ENCOUNTER — Ambulatory Visit (INDEPENDENT_AMBULATORY_CARE_PROVIDER_SITE_OTHER): Payer: Medicare Other | Admitting: Cardiovascular Disease

## 2014-08-03 VITALS — BP 110/82 | HR 85 | Ht 72.5 in | Wt 235.1 lb

## 2014-08-03 DIAGNOSIS — I482 Chronic atrial fibrillation, unspecified: Secondary | ICD-10-CM

## 2014-08-03 NOTE — Patient Instructions (Signed)
Your physician recommends that you continue on your current medications as directed. Please refer to the Current Medication list given to you today.  Your physician wants you to follow-up in: 1 year with Dr. Nahser.  You will receive a reminder letter in the mail two months in advance. If you don't receive a letter, please call our office to schedule the follow-up appointment.  

## 2014-08-03 NOTE — Assessment & Plan Note (Signed)
Adam Parrish is doing well. He has atrial fibrillation which is now well controlled. Continue on Coumadin. Is looking forward to getting more exercise. He will be starting a new diet  in February. Continue rate control and anticoagulation

## 2014-08-03 NOTE — Progress Notes (Signed)
Adam Parrish Date of Birth  August 16, 1946 [place] [address]    [place]   [place], [place]  [address]    [place], [place]  [address] [phone]  Fax  [phone]  [phone]  Fax [phone]  Problem LIst: 1. Atrial Fibrillation 2.  Chronic anticoagulation   History of Present Illness:  Adam Parrish is a 68 yo with chronic A-Fib.  He has done well.  He is exercising regularly.  He has not had any problems with syncope, presyncope  Dec. 23, 2014:  Adam Parrish is doing well.  Exercising regularly.  No CP or dyspnea.  Stable AF with well-controlled ventricular response. He remains on Coumadin.  He has his Coumadin levels checked through Dr. Eddie Dibbles office. His INR levels are well controlled.  Dec. 17, 2015:  Adam Parrish is doing well.  He is seen for follow up of his atrial fib. Typical aches and pain. No CP and dyspnea.     Current Outpatient Prescriptions on File Prior to Visit  Medication Sig Dispense Refill  . aspirin 81 MG tablet Take 81 mg by mouth daily.      Marland Kitchen glucosamine-chondroitin 500-400 MG tablet Take 1 tablet by mouth 2 (two) times daily.    . Methylsulfonylmethane (MSM PO) Take by mouth 4 (four) times daily.      . metoprolol (LOPRESSOR) 50 MG tablet Take 1 tablet by mouth two  times daily 180 tablet 0  . Multiple Vitamin (CALCIUM COMPLEX PO) Take 1 tablet by mouth daily.    . Tadalafil (CIALIS) 2.5 MG TABS Take 1 tablet (2.5 mg total) by mouth as needed. 20 each 0  . warfarin (COUMADIN) 5 MG tablet Take as directed 50 tablet 3   No current facility-administered medications on file prior to visit.    Allergies  Allergen Reactions  . Shellfish Allergy Nausea And Vomiting, Swelling and Rash    Past Medical History  Diagnosis Date  . Atrial fibrillation     Chronic   . Chronic anticoagulation     followed at [facility]  . Erectile dysfunction     Past Surgical History  Procedure Laterality Date  .  Vasectomy reversal  1986  . Circumcision revision  1986  . US echocardiography  07-24-2004    EF 55-60%  . Cardiovascular stress test  10-15-2005    EF 54%    History  Smoking status  . Never Smoker   Smokeless tobacco  . Not on file    History  Alcohol Use  . Yes    Comment: Occas.    Family History  Problem Relation Age of Onset  . Alzheimer's disease Mother   . Prostate cancer Father     Reviw of Systems:  Reviewed in the HPI.  All other systems are negative.  Physical Exam: Blood pressure 110/82, pulse 85, height 6' 0.5" (1.842 m), weight 235 lb 1.9 oz (106.65 kg). General: Well developed, well nourished, in no acute distress.  Head: Normocephalic, atraumatic, sclera non-icteric, mucus membranes are moist,   Neck: Supple. Negative for carotid bruits. JVD not elevated.  Lungs: Clear bilaterally to auscultation without wheezes, rales, or rhonchi. Breathing- normal.  Heart: irregularly irregular.  with S1 S2. No murmurs, rubs, or gallops    Abdomen: Soft, non-tender, non-distended with normoactive bowel sounds. No hepatomegaly. No rebound/guarding. No obvious abdominal masses.  Msk:    normal for age.  Extremities: No clubbing or cyanosis.  No edema.  Distal pedal pulses are 2+    Neuro: Alert and oriented X 3. Moves all extremities spontaneously.  Psych:  Responds to questions appropriately with a normal affect.  ECG:  Dec. 17, 2015:  Atrial fib at 43 ,   Assessment / Plan:

## 2014-09-16 ENCOUNTER — Other Ambulatory Visit: Payer: Self-pay | Admitting: Cardiovascular Disease

## 2015-06-10 ENCOUNTER — Other Ambulatory Visit: Payer: Self-pay | Admitting: Cardiovascular Disease

## 2015-08-06 ENCOUNTER — Encounter: Payer: Self-pay | Admitting: Cardiovascular Disease

## 2015-08-06 ENCOUNTER — Ambulatory Visit (INDEPENDENT_AMBULATORY_CARE_PROVIDER_SITE_OTHER): Payer: Medicare Other | Admitting: Cardiovascular Disease

## 2015-08-06 VITALS — BP 109/80 | HR 74 | Ht 72.5 in | Wt 205.4 lb

## 2015-08-06 DIAGNOSIS — I482 Chronic atrial fibrillation, unspecified: Secondary | ICD-10-CM

## 2015-08-06 NOTE — Patient Instructions (Signed)
Medication Instructions:  Your physician recommends that you continue on your current medications as directed. Please refer to the Current Medication list given to you today.   Labwork: None Ordered   Testing/Procedures: None Ordered   Follow-Up: Your physician wants you to follow-up in: 1 year with Dr. Nahser.  You will receive a reminder letter in the mail two months in advance. If you don't receive a letter, please call our office to schedule the follow-up appointment.   If you need a refill on your cardiac medications before your next appointment, please call your pharmacy.   Thank you for choosing [facility]! Ellora Varnum, RN [phone]    

## 2015-08-06 NOTE — Progress Notes (Signed)
Adam Parrish Date of Birth  02-09-1946 [place] [address]    [place]   [place], [place]  [address]    [place], [place]  [address] [phone]  Fax  [phone]  [phone]  Fax [phone]  Problem LIst: 1. Atrial Fibrillation 2.  Chronic anticoagulation   History of Present Illness:  Adam Parrish is a 69 yo with chronic A-Fib.  He has done well.  He is exercising regularly.  He has not had any problems with syncope, presyncope  Dec. 23, 2014:  Adam Parrish is doing well.  Exercising regularly.  No CP or dyspnea.  Stable AF with well-controlled ventricular response. He remains on Coumadin.  He has his Coumadin levels checked through Dr. Eddie Dibbles office. His INR levels are well controlled.  Dec. 17, 2015:  Adam Parrish is doing well.  He is seen for follow up of his atrial fib. Typical aches and pain. No CP and dyspnea.  Dec. 19, 2016:  Adam Parrish is doing ok from a cardiac standpoint. No Cp or dyspnea.  Tore his right ham string - while at work . [facility]    Current Outpatient Prescriptions on File Prior to Visit  Medication Sig Dispense Refill  . aspirin 81 MG tablet Take 81 mg by mouth daily.      Marland Kitchen glucosamine-chondroitin 500-400 MG tablet Take 1 tablet by mouth 2 (two) times daily.    . Methylsulfonylmethane (MSM PO) Take by mouth 4 (four) times daily.      . metoprolol (LOPRESSOR) 50 MG tablet Take 1 tablet by mouth two  times daily 180 tablet 1  . Multiple Vitamin (CALCIUM COMPLEX PO) Take 1 tablet by mouth daily.    . Tadalafil (CIALIS) 2.5 MG TABS Take 1 tablet (2.5 mg total) by mouth as needed. 20 each 0  . warfarin (COUMADIN) 5 MG tablet Take as directed 50 tablet 3   No current facility-administered medications on file prior to visit.    Allergies  Allergen Reactions  . Shellfish Allergy Nausea And Vomiting, Swelling and Rash    Past Medical History  Diagnosis Date  . Atrial  fibrillation (HCC)     Chronic   . Chronic anticoagulation     followed at [facility]  . Erectile dysfunction     Past Surgical History  Procedure Laterality Date  . Vasectomy reversal  1986  . Circumcision revision  1986  . US echocardiography  07-24-2004    EF 55-60%  . Cardiovascular stress test  10-15-2005    EF 54%    History  Smoking status  . Never Smoker   Smokeless tobacco  . Not on file    History  Alcohol Use  . Yes    Comment: Occas.    Family History  Problem Relation Age of Onset  . Alzheimer's disease Mother   . Prostate cancer Father     Reviw of Systems:  Reviewed in the HPI.  All other systems are negative.  Physical Exam: Blood pressure 109/80, pulse 74, height 6' 0.5" (1.842 m), weight 205 lb 6.4 oz (93.169 kg). General: Well developed, well nourished, in no acute distress.  Head: Normocephalic, atraumatic, sclera non-icteric, mucus membranes are moist,   Neck: Supple. Negative for carotid bruits. JVD not elevated.  Lungs: Clear bilaterally to auscultation without wheezes, rales, or rhonchi. Breathing- normal.  Heart: irregularly irregular.  with S1 S2. No murmurs, rubs,  or gallops    Abdomen: Soft, non-tender, non-distended with normoactive bowel sounds. No hepatomegaly. No rebound/guarding. No obvious abdominal masses.  Msk:    normal for age.  Extremities: No clubbing or cyanosis. No edema.  Distal pedal pulses are 2+    Neuro: Alert and oriented X 3. Moves all extremities spontaneously.  Psych:  Responds to questions appropriately with a normal affect.  ECG:  Dec.  19, 2016:  Atrial fib at 74.  No ST or T wave changes. ,   Assessment / Plan:   1. Atrial Fibrillation  - Very stable: Continue medications.  2.  Chronic anticoagulation - INR checked at his medical doctors office.      Verenis Nicosia, Wonda Cheng, MD  08/06/2015 3:24 PM    [place] [address],  [place], [place]  [address] Pager  [phone] Phone: [phone]; Fax: [phone]   [facility]  [address] [place],   [address] [phone]   Fax [phone]

## 2015-09-21 ENCOUNTER — Other Ambulatory Visit: Payer: Self-pay | Admitting: Cardiovascular Disease

## 2016-05-22 ENCOUNTER — Other Ambulatory Visit: Payer: Self-pay | Admitting: Cardiovascular Disease

## 2016-07-16 ENCOUNTER — Encounter: Payer: Self-pay | Admitting: Cardiovascular Disease

## 2016-07-30 ENCOUNTER — Encounter (INDEPENDENT_AMBULATORY_CARE_PROVIDER_SITE_OTHER): Payer: Self-pay

## 2016-07-30 ENCOUNTER — Encounter: Payer: Self-pay | Admitting: Cardiovascular Disease

## 2016-07-30 ENCOUNTER — Ambulatory Visit (INDEPENDENT_AMBULATORY_CARE_PROVIDER_SITE_OTHER): Payer: Medicare Other | Admitting: Cardiovascular Disease

## 2016-07-30 VITALS — BP 110/86 | HR 81 | Ht 72.5 in | Wt 212.4 lb

## 2016-07-30 DIAGNOSIS — I482 Chronic atrial fibrillation, unspecified: Secondary | ICD-10-CM

## 2016-07-30 NOTE — Patient Instructions (Signed)

## 2016-07-30 NOTE — Progress Notes (Signed)
Adam Parrish Date of Birth  1946/06/06 [place] [address]    [place]   [place], [place]  [address]    [place],   [address] [phone]  Fax  [phone]  [phone]  Fax [phone]  Problem LIst: 1. Atrial Fibrillation 2.  Chronic anticoagulation   Adam Parrish is a 70 yo with chronic A-Fib.  He has done well.  He is exercising regularly.  He has not had any problems with syncope, presyncope  Dec. 23, 2014:  Adam Parrish is doing well.  Exercising regularly.  No CP or dyspnea.  Stable AF with well-controlled ventricular response. He remains on Coumadin.  He has his Coumadin levels checked through Dr. Eddie Dibbles office. His INR levels are well controlled.  Dec. 17, 2015:  Adam Parrish is doing well.  He is seen for follow up of his atrial fib. Typical aches and pain. No CP and dyspnea.  Dec. 19, 2016:  Adam Parrish is doing ok from a cardiac standpoint. No Cp or dyspnea.  Tore his right ham string - while at work . [facility]   Dec. 13, 2017:  Adam Parrish is seen with wife Adam Parrish.  No CP or dyspnea.    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  On chronic coumadin - managed by Dr. Rex Kras .   Current Outpatient Prescriptions on File Prior to Visit  Medication Sig Dispense Refill  . aspirin 81 MG tablet Take 81 mg by mouth daily.      Marland Kitchen glucosamine-chondroitin 500-400 MG tablet Take 1 tablet by mouth 2 (two) times daily.    Marland Kitchen HYDROcodone-acetaminophen (NORCO/VICODIN) 5-325 MG tablet Take 1 tablet by mouth every 4 (four) hours as needed. AS NEEDED FOR PAIN    . Methylsulfonylmethane (MSM PO) Take by mouth 4 (four) times daily.      . metoprolol (LOPRESSOR) 50 MG tablet TAKE 1 TABLET BY MOUTH TWO  TIMES DAILY 180 tablet 0  . Multiple Vitamin (CALCIUM COMPLEX PO) Take 1 tablet by mouth daily.    . Tadalafil (CIALIS) 2.5 MG TABS Take 1 tablet (2.5 mg total) by mouth as needed. 20 each  0  . warfarin (COUMADIN) 5 MG tablet Take as directed 50 tablet 3   No current facility-administered medications on file prior to visit.     Allergies  Allergen Reactions  . Shellfish Allergy Nausea And Vomiting, Swelling and Rash    Past Medical History:  Diagnosis Date  . Atrial fibrillation (HCC)    Chronic   . Chronic anticoagulation    followed at [facility]  . Erectile dysfunction     Past Surgical History:  Procedure Laterality Date  . CARDIOVASCULAR STRESS TEST  10-15-2005   EF 54%  . CIRCUMCISION REVISION  1986  . US ECHOCARDIOGRAPHY  07-24-2004   EF 55-60%  . VASECTOMY REVERSAL  1986    History  Smoking Status  . Never Smoker  Smokeless Tobacco  . Not on file    History  Alcohol Use  . Yes    Comment: Occas.    Family History  Problem Relation Age of Onset  . Alzheimer's disease Mother   . Prostate cancer Father     Reviw of Systems:  Reviewed in the HPI.  All other systems are negative.  Physical Exam: Blood pressure 110/86, pulse 81, height 6' 0.5" (1.842 m), weight 212 lb  6.4 oz (96.3 kg). General: Well developed, well nourished, in no acute distress.  Head: Normocephalic, atraumatic, sclera non-icteric, mucus membranes are moist,   Neck: Supple. Negative for carotid bruits. JVD not elevated.  Lungs: Clear bilaterally to auscultation without wheezes, rales, or rhonchi. Breathing- normal.  Heart: irregularly irregular.  with S1 S2. No murmurs, rubs, or gallops    Abdomen: Soft, non-tender, non-distended with normoactive bowel sounds. No hepatomegaly. No rebound/guarding. No obvious abdominal masses.  Msk:    normal for age.  Extremities: No clubbing or cyanosis. No edema.  Distal pedal pulses are 2+    Neuro: Alert and oriented X 3. Moves all extremities spontaneously.  Psych:  Responds to questions appropriately with a normal affect.  ECG:  Dec.  13, 2017:   Atrial fib at 81.  NS T wave abn.    Assessment / Plan:   1. Atrial  Fibrillation  - Very stable: Continue medications.  2.  Chronic anticoagulation - INR checked at his medical doctors office.   Will see him in 1 year for office visit   [name], MD  07/30/2016 4:40 PM    [place],  [place], [place]  [address] Pager [phone] Phone: [phone]; Fax: [phone]   [facility]  [address] [place], [place]  [address] [phone]   Fax [phone]

## 2016-11-27 ENCOUNTER — Telehealth: Payer: Self-pay | Admitting: Cardiovascular Disease

## 2016-11-27 NOTE — Telephone Encounter (Signed)
New Message:   Pt said his wife is going to be put on Eliquis and he wants to be on Eliquis too.

## 2016-12-01 ENCOUNTER — Other Ambulatory Visit: Payer: Self-pay | Admitting: Cardiovascular Disease

## 2016-12-01 NOTE — Telephone Encounter (Signed)
-----   Message from Thayer Headings, MD sent at 11/27/2016  9:46 AM EDT ----- Regarding: RE: Switching to Eliquis OK with me if he switches from coumadin to eliquis. He has been followed by [facility] coumadin clinic so we do not have any information about his INR levels.  He may need to come to our coumadin clinic for an initial INR .  I have forwarded a copy of this to Fuller Canada, PharmD to help with the transition.  Thanks Phil  ----- Message ----- From: Stanton Kidney, RN Sent: 11/26/2016   5:52 PM To: Thayer Headings, MD, Emmaline Life, RN Subject: Switching to [facility] with patient's wife today ([place] pt).  She asked me to let you know that Mr. Blaze would like to switch to Eliquis. Please let the patient know if you are agreeable to switch.  thx Trinidad Curet, RN

## 2016-12-01 NOTE — Telephone Encounter (Signed)
Spoke with Dr. Eddie Dibbles nurse and she will speak with Dr. Rex Kras when he returns to office to coordinate transition.

## 2016-12-01 NOTE — Telephone Encounter (Signed)
New message      Calling to let Dr Acie Fredrickson know his pt/inr was 3.8 today at the PCP's office

## 2016-12-01 NOTE — Telephone Encounter (Signed)
Coumadin Clinic does not manage/monitor pt's Coumadin will forward results assume just FYI for Dr Acie Fredrickson, pt's PCP monitors.

## 2016-12-01 NOTE — Telephone Encounter (Signed)
Spoke to pt and he states that he was checked today and was 3.8. Will call Dr. Eddie Dibbles office to see if they can coordinate management of transition or if he will need to come to our office for transition.

## 2016-12-04 ENCOUNTER — Telehealth: Payer: Self-pay

## 2016-12-04 NOTE — Telephone Encounter (Signed)
Please advise patient is requesting refill on coumadin.  

## 2016-12-04 NOTE — Telephone Encounter (Signed)
Pt is followed by Dr. Rex Kras per our record. Dr. Rex Kras should do refill for patient as we would not know her dosing.

## 2016-12-08 MED ORDER — APIXABAN 5 MG PO TABS: 5.0000 mg | ORAL_TABLET | Freq: Two times a day (BID) | ORAL | 0 refills | Status: DC

## 2016-12-08 NOTE — Telephone Encounter (Signed)
Spoke to pt's wife today.  Mr Adam Parrish INR today was 3.1.  Discussed with Adam Parrish, pharmacist --  Advised to hold Coumadin today and tomorrow & begin Eliquis Wednesday. Wife verbalized understanding and agreeable to plan.  I will review w/ Dr. Acie Fredrickson as to whether pt needs to remain on ASA along w/ the Eliquis (pt was taking Coumadin & ASA).  Wife aware Adam Parrish or I will call with instructions on the ASA.

## 2016-12-09 NOTE — Telephone Encounter (Signed)
Pt will be starting Eliquis Please have him DC ASA when he starts the Eliquis

## 2016-12-09 NOTE — Telephone Encounter (Signed)
Advised pt to stop ASA when begins Eliquis. Patient verbalized understanding and agreeable to plan.

## 2016-12-11 ENCOUNTER — Other Ambulatory Visit: Payer: Self-pay | Admitting: Pharmacist

## 2016-12-11 ENCOUNTER — Telehealth: Payer: Self-pay | Admitting: Cardiovascular Disease

## 2016-12-11 MED ORDER — APIXABAN 5 MG PO TABS: 5.0000 mg | ORAL_TABLET | Freq: Two times a day (BID) | ORAL | 5 refills | Status: DC

## 2016-12-11 MED ORDER — APIXABAN 5 MG PO TABS: 5.0000 mg | ORAL_TABLET | Freq: Two times a day (BID) | ORAL | 1 refills | Status: DC

## 2016-12-11 MED ORDER — APIXABAN 5 MG PO TABS: 5.0000 mg | ORAL_TABLET | Freq: Two times a day (BID) | ORAL | 0 refills | Status: DC

## 2016-12-11 NOTE — Telephone Encounter (Signed)
°*  STAT* If patient is at the pharmacy, call can be transferred to refill team.   1. Which medications need to be refilled? (please list name of each medication and dose if known) eliquis  2. Which pharmacy/location (including street and city if local pharmacy) is medication to be sent to[facility] [place] 3. Do they need a 30 day or 90 day supply? 7    Pt wife verbalized that she went to the pharmacy and they didn't have a prescription  And that's why they couldn't give her a free trial

## 2016-12-11 NOTE — Telephone Encounter (Signed)
I can see that the 1 month prescription was already sent in and receipt was confirmed by the pharmacy. Will send again.

## 2016-12-15 ENCOUNTER — Telehealth: Payer: Self-pay | Admitting: Cardiovascular Disease

## 2016-12-15 NOTE — Telephone Encounter (Signed)
New Message   Pt c/o medication issue:  1. Name of Medication: Tadalafil (CIALIS) 2.5 MG TABS  2. How are you currently taking this medication (dosage and times per day)? As needed  3. Are you having a reaction (difficulty breathing--STAT)? No  4. What is your medication issue? Per pt wife pt gets headaches when he takes medication, and he wants to know if he could try the viagra. Requesting call back

## 2016-12-16 NOTE — Telephone Encounter (Signed)
Spoke with patient who states he called to ask if generic viagra would be a better Rx for him rather than Cialis. I advised that Dr. Acie Fredrickson requested he contact his PCP for future refills when this was prescribed in 2015. He verbalized understanding and thanked me for the call.

## 2016-12-30 ENCOUNTER — Other Ambulatory Visit: Payer: Self-pay | Admitting: *Deleted

## 2016-12-30 MED ORDER — APIXABAN 5 MG PO TABS
5.0000 mg | ORAL_TABLET | Freq: Two times a day (BID) | ORAL | 1 refills | Status: DC
Start: 2016-12-30 — End: 2017-07-23

## 2017-07-23 ENCOUNTER — Other Ambulatory Visit: Payer: Self-pay | Admitting: Cardiovascular Disease

## 2017-07-23 NOTE — Telephone Encounter (Signed)
Pt last saw Dr Acie Fredrickson 07/30/16, has upcoming appt scheduled for 08/04/17 1 year follow-up. Last labs 11/26/16 Creat 0.97, weight 96.3kg, age 71, based on specified criteria pt is on appropriate dosage of Eliquis 5mg  BID.  Will refill rx.

## 2017-08-04 ENCOUNTER — Ambulatory Visit: Payer: Medicare Other | Admitting: Cardiovascular Disease

## 2017-08-04 ENCOUNTER — Encounter: Payer: Self-pay | Admitting: Cardiovascular Disease

## 2017-08-04 VITALS — BP 114/80 | HR 75 | Ht 72.0 in | Wt 221.0 lb

## 2017-08-04 DIAGNOSIS — I482 Chronic atrial fibrillation, unspecified: Secondary | ICD-10-CM

## 2017-08-04 NOTE — Patient Instructions (Signed)

## 2017-08-04 NOTE — Progress Notes (Signed)
Adam Parrish Date of Birth  January 31, 1946 [place] [address]    [place]   [place], [place]  [address]    [place], [place]  [address] [phone]  Fax  [phone]  [phone]  Fax [phone]  Problem LIst: 1. Atrial Fibrillation 2.  Chronic anticoagulation   Adam Parrish is a 71 yo with chronic A-Fib.  He has done well.  He is exercising regularly.  He has not had any problems with syncope, presyncope  Dec. 23, 2014:  Adam Parrish is doing well.  Exercising regularly.  No CP or dyspnea.  Stable AF with well-controlled ventricular response. He remains on Coumadin.  He has his Coumadin levels checked through Dr. Eddie Parrish office. His INR levels are well controlled.  Dec. 17, 2015:  Adam Parrish is doing well.  He is seen for follow up of his atrial fib. Typical aches and pain. No CP and dyspnea.  Dec. 19, 2016:  Adam Parrish is doing ok from a cardiac standpoint. No Cp or dyspnea.  Tore his right ham string - while at work . [facility]   Dec. 13, 2017:  Adam Parrish is seen with wife Adam Parrish.  No CP or dyspnea.    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  On chronic coumadin - managed by Dr. Rex Parrish .   August 04, 2017:  Feeling well.    Has had a cold for 2 weeks.  No CP or dyspnea.   Current Outpatient Medications on File Prior to Visit  Medication Sig Dispense Refill  . diptheria-tetanus toxoids [facility]) 2-2 LF/0.5ML injection Inject into the muscle once.    Marland Kitchen ELIQUIS 5 MG TABS tablet TAKE 1 TABLET BY MOUTH TWO  TIMES DAILY 180 tablet 1  . glucosamine-chondroitin 500-400 MG tablet Take 1 tablet by mouth 2 (two) times daily.    . Methylsulfonylmethane (MSM PO) Take by mouth. 1/3 TSP DAILY    . metoprolol (LOPRESSOR) 50 MG tablet TAKE 1 TABLET BY MOUTH TWO  TIMES DAILY 180 tablet 3  . Multiple Vitamin (CALCIUM COMPLEX PO) Take 1 tablet by mouth daily.    . Tadalafil (CIALIS) 2.5 MG  TABS Take 1 tablet (2.5 mg total) by mouth as needed. 20 each 0   No current facility-administered medications on file prior to visit.     Allergies  Allergen Reactions  . Shellfish Allergy Nausea And Vomiting, Swelling and Rash    Past Medical History:  Diagnosis Date  . Atrial fibrillation (HCC)    Chronic   . Chronic anticoagulation    followed at [facility]  . Erectile dysfunction     Past Surgical History:  Procedure Laterality Date  . CARDIOVASCULAR STRESS TEST  10-15-2005   EF 54%  . CIRCUMCISION REVISION  1986  . US ECHOCARDIOGRAPHY  07-24-2004   EF 55-60%  . VASECTOMY REVERSAL  1986    Social History   Tobacco Use  Smoking Status Never Smoker  Smokeless Tobacco Never Used    Social History   Substance and Sexual Activity  Alcohol Use Yes   Comment: Occas.    Family History  Problem Relation Age of Onset  . Alzheimer's disease Mother   . Prostate cancer Father     Reviw of Systems:  Reviewed in the HPI.  All other systems are negative.  Physical Exam: Blood pressure 114/80, pulse 75, height 6' (1.829  m), weight 221 lb (100.2 kg), SpO2 97 %.  GEN:  Well nourished, well developed in no acute distress HEENT: Normal NECK: No JVD; No carotid bruits LYMPHATICS: No lymphadenopathy CARDIAC: Irreg.   Irreg , no murmurs, rubs, gallops RESPIRATORY:  Clear to auscultation without rales, wheezing or rhonchi  ABDOMEN: Soft, non-tender, non-distended MUSCULOSKELETAL:  No edema; No deformity  SKIN: Warm and dry NEUROLOGIC:  Alert and oriented x 3    ECG:  August 04, 2017: Atrial fibrillation with a ventricular rate of 75.  No ST or T wave changes.   Assessment / Plan:   1. Atrial Fibrillation  - Very stable: Continue medications. On Eliquis  Remains asymptomatic.    2.  Chronic anticoagulation - INR checked at his medical doctors office.   Will see him in 1 year for office visit   Adam Moores, MD  08/04/2017 4:06 PM    Adam Parrish,  [place], [place]  [address] Pager [phone] Phone: [phone]; Fax: [phone]

## 2017-10-17 ENCOUNTER — Other Ambulatory Visit: Payer: Self-pay | Admitting: Cardiovascular Disease

## 2018-01-16 ENCOUNTER — Other Ambulatory Visit: Payer: Self-pay | Admitting: Cardiovascular Disease

## 2018-01-18 NOTE — Telephone Encounter (Signed)
Eliquis 5mg  refill request received; pt is 72 yrs old, wt-100.2kg, Crea-0.85 on 07/23/17 via scanned labs from PCP, last seen by Dr. Acie Fredrickson on 08/04/17; will end in refill to requested Pharmacy.

## 2018-03-22 ENCOUNTER — Telehealth: Payer: Self-pay

## 2018-03-22 NOTE — Telephone Encounter (Signed)
**Note De-Identified Jarone Ostergaard Obfuscation** The pts wife, [name], mailed the pts (along with hers) [employer] Pt Asst application for Eliquis to the office along with a pre paid [employer] envelope that she is requesting that we mail his Pt Asst application to [employer] in. She does not want it faxed.  I have completed the provider part of the application and placed it in Dr Elmarie Shiley mail bin awaiting his signature.

## 2018-03-23 NOTE — Telephone Encounter (Signed)
Dr Acie Fredrickson has signed the pts [employer] pt asst application for Eliquis.  I have mailed the pt's and his wife's [employer] Pt Asst applications to [employer] in the pre paid [employer] envelope provided by Mrs. Berrey per her request.  Also, I have mailed a copy of the pt and his wife's signed "provider part" of the [employer] Pt Asst applications to their address for their records per Mrs Partain request.

## 2018-05-28 ENCOUNTER — Other Ambulatory Visit: Payer: Self-pay | Admitting: Cardiovascular Disease

## 2018-06-30 ENCOUNTER — Encounter: Payer: Self-pay | Admitting: Nurse Practitioner

## 2018-06-30 ENCOUNTER — Encounter (INDEPENDENT_AMBULATORY_CARE_PROVIDER_SITE_OTHER): Payer: Self-pay

## 2018-06-30 ENCOUNTER — Ambulatory Visit: Payer: Medicare Other | Admitting: Cardiovascular Disease

## 2018-06-30 ENCOUNTER — Encounter: Payer: Self-pay | Admitting: Cardiovascular Disease

## 2018-06-30 VITALS — BP 122/76 | HR 74 | Ht 72.0 in | Wt 220.0 lb

## 2018-06-30 DIAGNOSIS — R0789 Other chest pain: Secondary | ICD-10-CM | POA: Diagnosis not present

## 2018-06-30 DIAGNOSIS — I482 Chronic atrial fibrillation, unspecified: Secondary | ICD-10-CM

## 2018-06-30 NOTE — Progress Notes (Signed)
Adam Parrish Date of Birth  10-Jul-1946 [place] [address]    [place]   [place], [place]  [address]    [place], [place]  [address] [phone]  Fax  [phone]  [phone]  Fax [phone]  Problem LIst: 1. Atrial Fibrillation 2.  Chronic anticoagulation   Adam Parrish is a 72 y.o.  Man with chronic atrial fib   Dec. 23, 2014:  Adam Parrish is doing well.  Exercising regularly.  No CP or dyspnea.  Stable AF with well-controlled ventricular response. He remains on Coumadin.  He has his Coumadin levels checked through Adam Parrish office. His INR levels are well controlled.  Dec. 17, 2015:  Adam Parrish is doing well.  He is seen for follow up of his atrial fib. Typical aches and pain. No CP and dyspnea.  Dec. 19, 2016:  Adam Parrish is doing ok from a cardiac standpoint. No Cp or dyspnea.  Tore his right ham string - while at work . INR is [facility] at [facility].   Dec. 13, 2017:  Adam Parrish is seen with wife Adam Parrish.  No CP or dyspnea.    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  On chronic coumadin - managed by Adam Parrish .   August 04, 2017:  Feeling well.    Has had a cold for 2 weeks.  No CP or dyspnea.  June 30, 2018: Adam Parrish is seen today as a work in visit.  He was at his primary medical doctor's office yesterday and complained of having some episodes of chest pain.  He has a history of chronic atrial fibrillation.  We was at work on Monday  Picked up a large roll of wire ( 125 lbs)  Got dizzy  Did not feel well for the rest of the work day  [employer] , took a nap and felt better  Has a constant chest pressure .  The pressure seemed to be relieved wth lifting his arm up    Current Outpatient Medications on File Prior to Visit  Medication Sig Dispense Refill  . diptheria-tetanus toxoids [facility]) 2-2 LF/0.5ML injection Inject into the muscle once.    Marland Kitchen ELIQUIS 5 MG TABS tablet TAKE 1  TABLET BY MOUTH TWO  TIMES DAILY 180 tablet 1  . glucosamine-chondroitin 500-400 MG tablet Take 1 tablet by mouth 2 (two) times daily.    . Methylsulfonylmethane (MSM PO) Take by mouth. 1/3 TSP DAILY    . metoprolol tartrate (LOPRESSOR) 50 MG tablet Take 50 mg by mouth 2 (two) times daily.    . Multiple Vitamin (CALCIUM COMPLEX PO) Take 1 tablet by mouth daily.    . nitroGLYCERIN (NITROSTAT) 0.4 MG SL tablet Place 1 tablet under the tongue daily as needed for chest pain.    . Tadalafil (CIALIS) 2.5 MG TABS Take 1 tablet (2.5 mg total) by mouth as needed. 20 each 0   No current facility-administered medications on file prior to visit.     Allergies  Allergen Reactions  . Shellfish Allergy Nausea And Vomiting, Swelling and Rash    Past Medical History:  Diagnosis Date  . Atrial fibrillation (HCC)    Chronic   . Chronic anticoagulation    followed at [facility]  . Erectile dysfunction     Past Surgical History:  Procedure Laterality Date  . CARDIOVASCULAR STRESS TEST  10-15-2005   EF 54%  .  CIRCUMCISION REVISION  1986  . US ECHOCARDIOGRAPHY  07-24-2004   EF 55-60%  . VASECTOMY REVERSAL  1986    Social History   Tobacco Use  Smoking Status Never Smoker  Smokeless Tobacco Never Used    Social History   Substance and Sexual Activity  Alcohol Use Yes   Comment: Occas.    Family History  Problem Relation Age of Onset  . Alzheimer's disease Mother   . Prostate cancer Father     Reviw of Systems:  Reviewed in the HPI.  All other systems are negative.  Physical Exam: Blood pressure 122/76, pulse 74, height 6' (1.829 m), weight 220 lb (99.8 kg), SpO2 98 %.  GEN:  Well nourished, well developed in no acute distress HEENT: Normal NECK: No JVD; No carotid bruits LYMPHATICS: No lymphadenopathy CARDIAC:  Irreg. Irreg.  RESPIRATORY:  Clear to auscultation without rales, wheezing or rhonchi  ABDOMEN: Soft, non-tender, non-distended MUSCULOSKELETAL:  No edema; No  deformity  SKIN: Warm and dry NEUROLOGIC:  Alert and oriented x 3    ECG:  June 30, 2018: Atrial fibrillation at a rate of 74.  No ST or T wave changes.   Assessment / Plan:   1.  Chest pain : Adam Parrish has continued to have episodes of chest pain since Monday.  He was at work and lifted a heavy bale of wire.  Since that time he is had intermittent episodes of chest pain and pressure.  These episodes typically last for 10 minutes or so.  He has not had to take any nitroglycerin.  I would like to schedule him for a Lexiscan Myoview study.  Anticipate seeing him back in 3 weeks.  If the Myoview study is abnormal then we will need to refer him for heart catheterization.   1. Atrial Fibrillation Stable ,  Cont. Eliquis    2.  Chronic anticoagulation -  Continue Eliquis     [name], MD  06/30/2018 11:08 AM    [place],  [place], [place]  [address] Pager [phone] Phone: [phone]; Fax: [phone]

## 2018-06-30 NOTE — Patient Instructions (Addendum)
Medication Instructions:  Your physician recommends that you continue on your current medications as directed. Please refer to the Current Medication list given to you today.  If you need a refill on your cardiac medications before your next appointment, please call your pharmacy.   Lab work: None Ordered  If you have labs (blood work) drawn today and your tests are completely normal, you will receive your results only by: Marland Kitchen MyChart Message (if you have MyChart) OR . A paper copy in the mail If you have any lab test that is abnormal or we need to change your treatment, we will call you to review the results.   Testing/Procedures: Your physician has requested that you have a lexiscan myoview. For further information please visit [email or web address]. Please follow instruction sheet, as given.    Follow-Up: At [facility], you and your health needs are our priority.  As part of our continuing mission to provide you with exceptional heart care, we have created designated Provider Care Teams.  These Care Teams include your primary Cardiologist (physician) and Advanced Practice Providers (APPs -  Physician Assistants and Nurse Practitioners) who all work together to provide you with the care you need, when you need it. You will need a follow up appointment on December 18 with Dr. Orion Crook or one of the following Advanced Practice Providers on your designated Care Team: Richardson Dopp, PA-C [place], [place] . Daune Perch, NP

## 2018-07-08 ENCOUNTER — Telehealth (HOSPITAL_COMMUNITY): Payer: Self-pay | Admitting: *Deleted

## 2018-07-08 NOTE — Telephone Encounter (Signed)
Left message on voicemail per DPR in reference to upcoming appointment scheduled on 07/13/18 with detailed instructions given per Myocardial Perfusion Study Information Sheet for the test. LM to arrive 15 minutes early, and that it is imperative to arrive on time for appointment to keep from having the test rescheduled. If you need to cancel or reschedule your appointment, please call the office within 24 hours of your appointment. Failure to do so may result in a cancellation of your appointment, and a $50 no show fee. Phone number given for call back for any questions. Kirstie Peri

## 2018-07-13 ENCOUNTER — Ambulatory Visit (HOSPITAL_COMMUNITY): Payer: Medicare Other | Attending: Cardiology

## 2018-07-13 DIAGNOSIS — R0789 Other chest pain: Secondary | ICD-10-CM | POA: Insufficient documentation

## 2018-07-13 LAB — MYOCARDIAL PERFUSION IMAGING
CHL CUP NUCLEAR SDS: 1
CHL CUP NUCLEAR SSS: 1
CHL CUP RESTING HR STRESS: 65 {beats}/min
LVDIAVOL: 105 mL (ref 62–150)
LVSYSVOL: 58 mL
NUC STRESS TID: 1.09
Peak HR: 93 {beats}/min
SRS: 0

## 2018-07-13 MED ORDER — REGADENOSON 0.4 MG/5ML IV SOLN
0.4000 mg | Freq: Once | INTRAVENOUS | Status: AC
  Administered 2018-07-13: 0.4 mg via INTRAVENOUS

## 2018-07-13 MED ORDER — TECHNETIUM TC 99M TETROFOSMIN IV KIT
10.2000 | PACK | Freq: Once | INTRAVENOUS | Status: AC | PRN
  Administered 2018-07-13: 10.2 via INTRAVENOUS
  Filled 2018-07-13: qty 11

## 2018-07-13 MED ORDER — TECHNETIUM TC 99M TETROFOSMIN IV KIT
31.9000 | PACK | Freq: Once | INTRAVENOUS | Status: AC | PRN
  Administered 2018-07-13: 31.9 via INTRAVENOUS
  Filled 2018-07-13: qty 32

## 2018-07-24 ENCOUNTER — Other Ambulatory Visit: Payer: Self-pay | Admitting: Cardiovascular Disease

## 2018-08-04 ENCOUNTER — Encounter: Payer: Self-pay | Admitting: Cardiovascular Disease

## 2018-08-04 ENCOUNTER — Ambulatory Visit: Payer: Medicare Other | Admitting: Cardiovascular Disease

## 2018-08-04 VITALS — BP 132/80 | HR 87 | Ht 72.0 in | Wt 229.4 lb

## 2018-08-04 DIAGNOSIS — R079 Chest pain, unspecified: Secondary | ICD-10-CM

## 2018-08-04 DIAGNOSIS — I482 Chronic atrial fibrillation, unspecified: Secondary | ICD-10-CM | POA: Diagnosis not present

## 2018-08-04 MED ORDER — APIXABAN 5 MG PO TABS: 5.0000 mg | ORAL_TABLET | Freq: Two times a day (BID) | ORAL | 0 refills | Status: DC

## 2018-08-04 NOTE — Patient Instructions (Signed)
Medication Instructions:  Your physician recommends that you continue on your current medications as directed. Please refer to the Current Medication list given to you today.  If you need a refill on your cardiac medications before your next appointment, please call your pharmacy.   Lab work: None  If you have labs (blood work) drawn today and your tests are completely normal, you will receive your results only by: Marland Kitchen MyChart Message (if you have MyChart) OR . A paper copy in the mail If you have any lab test that is abnormal or we need to change your treatment, we will call you to review the results.  Testing/Procedures: Your physician has requested that you have an echocardiogram. Echocardiography is a painless test that uses sound waves to create images of your heart. It provides your doctor with information about the size and shape of your heart and how well your heart's chambers and valves are working. This procedure takes approximately one hour. There are no restrictions for this procedure.   Follow-Up: At [facility], you and your health needs are our priority.  As part of our continuing mission to provide you with exceptional heart care, we have created designated Provider Care Teams.  These Care Teams include your primary Cardiologist (physician) and Advanced Practice Providers (APPs -  Physician Assistants and Nurse Practitioners) who all work together to provide you with the care you need, when you need it. You will need a follow up appointment in:  6 months.  Please call our office 2 months in advance to schedule this appointment.  You may see [name], MD or one of the following Advanced Practice Providers on your designated Care Team: Richardson Dopp, PA-C [place], [place] . Daune Perch, NP  Any Other Special Instructions Will Be Listed Below (If Applicable).

## 2018-08-04 NOTE — Progress Notes (Signed)
Adam Parrish Date of Birth  20-Sep-1945 [place] [address]    [place]   [place], [place]  [address]    Adam Parrish, [place]  [address] [phone]  Fax  [phone]  [phone]  Fax [phone]  Problem LIst: 1. Atrial Fibrillation 2.  Chronic anticoagulation   Adam Parrish is a 72 y.o.  Man with chronic atrial fib   Dec. 23, 2014:  Adam Parrish is doing well.  Exercising regularly.  No CP or dyspnea.  Stable AF with well-controlled ventricular response. He remains on Coumadin.  He has his Coumadin levels checked through Dr. Eddie Dibbles office. His INR levels are well controlled.  Dec. 17, 2015:  Adam Parrish is doing well.  He is seen for follow up of his atrial fib. Typical aches and pain. No CP and dyspnea.  Dec. 19, 2016:  Adam Parrish is doing ok from a cardiac standpoint. No Cp or dyspnea.  Tore his right ham string - while at work . [facility]R [facility].   Dec. 13, 2017:  Adam Parrish is seen with wife [name].  No CP or dyspnea.    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  On chronic coumadin - managed by Dr. Rex Kras .   August 04, 2017:  Feeling well.    Has had a cold for 2 weeks.  No CP or dyspnea.  June 30, 2018: Adam Parrish is seen today as a work in visit.  He was at his primary medical doctor's office yesterday and complained of having some episodes of chest pain.  He has a history of chronic atrial fibrillation.  We was at work on Monday  Picked up a large roll of wire ( 125 lbs)  Got dizzy  Did not feel well for the rest of the work day  [employer] , took a nap and felt better  Has a constant chest pressure .  The pressure seemed to be relieved wth lifting his arm up   Dec. 18, 2019:  Adam Parrish is seen today for follow-up of an episode of chest pain that he had a month or so ago.  We performed a stress Myoview study.  He had no evidence of ischemia.  The ejection fraction was 45%.  No  dyspnea.  No further CP .   Current Outpatient Medications on File Prior to Visit  Medication Sig Dispense Refill  . diptheria-tetanus toxoids [facility]) 2-2 LF/0.5ML injection Inject into the muscle once.    Marland Kitchen ELIQUIS 5 MG TABS tablet TAKE 1 TABLET BY MOUTH TWO  TIMES DAILY 60 tablet 3  . glucosamine-chondroitin 500-400 MG tablet Take 1 tablet by mouth 2 (two) times daily.    . Methylsulfonylmethane (MSM PO) Take by mouth. 1/3 TSP DAILY    . metoprolol tartrate (LOPRESSOR) 50 MG tablet Take 50 mg by mouth 2 (two) times daily.    . Multiple Vitamin (CALCIUM COMPLEX PO) Take 1 tablet by mouth daily.    . nitroGLYCERIN (NITROSTAT) 0.4 MG SL tablet Place 1 tablet under the tongue daily as needed for chest pain.    . Tadalafil (CIALIS) 2.5 MG TABS Take 1 tablet (2.5 mg total) by mouth as needed. 20 each 0   No current facility-administered medications on file prior to visit.     Allergies  Allergen Reactions  . Shellfish Allergy Nausea And Vomiting, Swelling and Rash    Past  Medical History:  Diagnosis Date  . Atrial fibrillation (HCC)    Chronic   . Chronic anticoagulation    followed at [facility]  . Erectile dysfunction     Past Surgical History:  Procedure Laterality Date  . CARDIOVASCULAR STRESS TEST  10-15-2005   EF 54%  . CIRCUMCISION REVISION  1986  . US ECHOCARDIOGRAPHY  07-24-2004   EF 55-60%  . VASECTOMY REVERSAL  1986    Social History   Tobacco Use  Smoking Status Never Smoker  Smokeless Tobacco Never Used    Social History   Substance and Sexual Activity  Alcohol Use Yes   Comment: Occas.    Family History  Problem Relation Age of Onset  . Alzheimer's disease Mother   . Prostate cancer Father     Reviw of Systems:  Reviewed in the HPI.  All other systems are negative.  Physical Exam: Blood pressure 132/80, pulse 87, height 6' (1.829 m), weight 229 lb 6.4 oz (104.1 kg), SpO2 97 %.  GEN:  Well nourished, well developed in no acute  distress HEENT: Normal NECK: No JVD; No carotid bruits LYMPHATICS: No lymphadenopathy CARDIAC: irreg. Irreg.  RESPIRATORY:  Clear to auscultation without rales, wheezing or rhonchi  ABDOMEN: Soft, non-tender, non-distended MUSCULOSKELETAL:  No edema; No deformity  SKIN: Warm and dry NEUROLOGIC:  Alert and oriented x 3    ECG:    Assessment / Plan:   1.  Chest pain :   Low risk myoview.    EF was 45%. Will get an echo to verify the EF    1. Atrial Fibrillation Stable,  Chronic ,  HR is well controlled.   2.  Chronic anticoagulation -  Continue Eliquis  Will send in a 1 month script for eliquis to [facility]     [name], MD  08/04/2018 4:43 PM    [place],  [place], [place]  [address] Pager [phone] Phone: [phone]; Fax: [phone]

## 2018-08-16 ENCOUNTER — Other Ambulatory Visit: Payer: Self-pay

## 2018-08-16 ENCOUNTER — Ambulatory Visit (HOSPITAL_COMMUNITY): Payer: Medicare Other | Attending: Cardiology

## 2018-08-16 DIAGNOSIS — I482 Chronic atrial fibrillation, unspecified: Secondary | ICD-10-CM | POA: Insufficient documentation

## 2018-08-20 ENCOUNTER — Other Ambulatory Visit: Payer: Self-pay | Admitting: *Deleted

## 2018-08-20 MED ORDER — METOPROLOL TARTRATE 50 MG PO TABS: 50.0000 mg | ORAL_TABLET | Freq: Two times a day (BID) | ORAL | 1 refills | Status: DC

## 2018-08-30 ENCOUNTER — Telehealth: Payer: Self-pay | Admitting: Cardiovascular Disease

## 2018-08-30 NOTE — Telephone Encounter (Signed)
When looking up Adam Parrish's echo result, I noted the imaging had not uploaded to Epic. The echo dept has since been able to upload Adam Parrish's imaging. I will forward to Dr Acie Fredrickson and his RN for review.  Adam Parrish has been made aware his echo will be reviewed this week and he will receive a call back. He verbalized understanding and thanked me for the call.

## 2018-08-30 NOTE — Telephone Encounter (Signed)
New message     Pt wants to discuss  Echo test results.

## 2018-09-02 ENCOUNTER — Other Ambulatory Visit: Payer: Self-pay | Admitting: Family Medicine

## 2018-09-02 DIAGNOSIS — Z87891 Personal history of nicotine dependence: Secondary | ICD-10-CM

## 2018-09-09 ENCOUNTER — Ambulatory Visit
Admission: RE | Admit: 2018-09-09 | Discharge: 2018-09-09 | Disposition: A | Discharge status: Home / Self Care | Payer: Medicare Other | Source: Ambulatory Visit | Attending: Family Medicine | Admitting: Family Medicine

## 2018-09-09 DIAGNOSIS — Z87891 Personal history of nicotine dependence: Secondary | ICD-10-CM

## 2018-11-10 ENCOUNTER — Other Ambulatory Visit: Payer: Self-pay

## 2018-11-10 MED ORDER — APIXABAN 5 MG PO TABS: 5.0000 mg | ORAL_TABLET | Freq: Two times a day (BID) | ORAL | 2 refills | Status: DC

## 2018-11-10 NOTE — Telephone Encounter (Signed)
Pt last saw Dr Acie Fredrickson 08/04/18, last labs at [facility] 09/01/18 Creat 0.83, age 73, weight 104.1kg, based on specified criteria pt is on appropriate dosage of Eliquis 5mg  BID.  Will refill rx.

## 2019-01-25 ENCOUNTER — Other Ambulatory Visit: Payer: Self-pay

## 2019-01-25 ENCOUNTER — Encounter: Payer: Self-pay | Admitting: Cardiovascular Disease

## 2019-01-25 ENCOUNTER — Telehealth (INDEPENDENT_AMBULATORY_CARE_PROVIDER_SITE_OTHER): Payer: Medicare Other | Admitting: Cardiovascular Disease

## 2019-01-25 VITALS — BP 115/78 | HR 65 | Ht 73.0 in | Wt 212.2 lb

## 2019-01-25 DIAGNOSIS — Z7189 Other specified counseling: Secondary | ICD-10-CM | POA: Diagnosis not present

## 2019-01-25 DIAGNOSIS — I482 Chronic atrial fibrillation, unspecified: Secondary | ICD-10-CM | POA: Diagnosis not present

## 2019-01-25 NOTE — Patient Instructions (Signed)

## 2019-01-25 NOTE — Progress Notes (Signed)
Virtual Visit via Video Note   This visit type was conducted due to national recommendations for restrictions regarding the COVID-19 Pandemic (e.g. social distancing) in an effort to limit this patient's exposure and mitigate transmission in our community.  Due to his co-morbid illnesses, this patient is at least at moderate risk for complications without adequate follow up.  This format is felt to be most appropriate for this patient at this time.  All issues noted in this document were discussed and addressed.  A limited physical exam was performed with this format.  Please refer to the patient's chart for his consent to telehealth for [facility].   Date:  01/25/2019   ID:  Adam Parrish, DOB 1946/03/07, MRN [id number]  Patient Location: Home Provider Location: Home  PCP:  Hulan Fess, MD  Cardiologist:  [name], MD  Electrophysiologist:  None   Evaluation Performed:  Follow-Up Visit  Chief Complaint:  Atrial fib  History of Present Illness:    Adam Parrish is a 73 y.o. male with with atrial fib. Working all the time.   Works at [employer] .    No cp , no dyspnea. Not much exercise.  Up and down ladders and works around in [employer] .  VS look good  No symptoms    The patient does not have symptoms concerning for COVID-19 infection (fever, chills, cough, or new shortness of breath).    Past Medical History:  Diagnosis Date  . Atrial fibrillation (HCC)    Chronic   . Chronic anticoagulation    follow[facility] at [facility]  . Erectile dysfunction    Past Surgical History:  Procedure Laterality Date  . CARDIOVASCULAR STRESS TEST  10-15-2005   EF 54%  . CIRCUMCISION REVISION  1986  . US ECHOCARDIOGRAPHY  07-24-2004   EF 55-60%  . VASECTOMY REVERSAL  1986     Current Meds  Medication Sig  . apixaban (ELIQUIS) 5 MG TABS tablet Take 1 tablet (5 mg total) by mouth 2 (two) times daily.  Marland Kitchen diptheria-tetanus toxoids [facility]) 2-2 LF/0.5ML injection Inject into the  muscle once.  Marland Kitchen glucosamine-chondroitin 500-400 MG tablet Take 1 tablet by mouth 2 (two) times daily.  . Methylsulfonylmethane (MSM PO) Take by mouth. 1/3 TSP DAILY  . metoprolol tartrate (LOPRESSOR) 50 MG tablet Take 1 tablet (50 mg total) by mouth 2 (two) times daily.  . Multiple Vitamin (CALCIUM COMPLEX PO) Take 1 tablet by mouth daily.  . nitroGLYCERIN (NITROSTAT) 0.4 MG SL tablet Place 1 tablet under the tongue daily as needed for chest pain.  . Tadalafil (CIALIS) 2.5 MG TABS Take 1 tablet (2.5 mg total) by mouth as needed.     Allergies:   Shellfish allergy   Social History   Tobacco Use  . Smoking status: Never Smoker  . Smokeless tobacco: Never Used  Substance Use Topics  . Alcohol use: Yes    Comment: Occas.  . Drug use: Not on file     Family Hx: The patient's family history includes Alzheimer's disease in his mother; Prostate cancer in his father.  ROS:   Please see the history of present illness.     All other systems reviewed and are negative.   Prior CV studies:   The following studies were reviewed today:    Labs/Other Tests and Data Reviewed:    EKG:  No ECG reviewed.  Recent Labs: No results found for requested labs within last 8760 hours.   Recent Lipid Panel  No results found for: CHOL, TRIG, HDL, CHOLHDL, LDLCALC, LDLDIRECT  Wt Readings from Last 3 Encounters:  01/25/19 212 lb 3.2 oz (96.3 kg)  08/04/18 229 lb 6.4 oz (104.1 kg)  07/13/18 220 lb (99.8 kg)     Objective:    Vital Signs:  BP 115/78 (BP Location: Left Arm, Patient Position: Sitting, Cuff Size: Normal)   Pulse 65   Ht 6\' 1"  (1.854 m)   Wt 212 lb 3.2 oz (96.3 kg)   BMI 28.00 kg/m    VITAL SIGNS:  reviewed GEN:  no acute distress EYES:  sclerae anicteric, EOMI - Extraocular Movements Intact RESPIRATORY:  normal respiratory effort, symmetric expansion CARDIOVASCULAR:  no peripheral edema SKIN:  no rash, lesions or ulcers. MUSCULOSKELETAL:  no obvious deformities. NEURO:   alert and oriented x 3, no obvious focal deficit PSYCH:  normal affect  ASSESSMENT & PLAN:    1. Atrial fib:   Chronic.   Stable.  contiue eliquis.   Continue same meds.   COVID-19 Education: The signs and symptoms of COVID-19 were discussed with the patient and how to seek care for testing (follow up with PCP or arrange E-visit).  The importance of social distancing was discussed today.  Time:   Today, I have spent  18  minutes with the patient with telehealth technology discussing the above problems.     Medication Adjustments/Labs and Tests Ordered: Current medicines are reviewed at length with the patient today.  Concerns regarding medicines are outlined above.   Tests Ordered: No orders of the defined types were placed in this encounter.   Medication Changes: No orders of the defined types were placed in this encounter.   Disposition:  Follow up in 6 month(s)  Signed, [name], MD  01/25/2019 4:22 PM    [facility]

## 2019-07-19 ENCOUNTER — Other Ambulatory Visit: Payer: Self-pay | Admitting: Cardiovascular Disease

## 2019-07-26 ENCOUNTER — Encounter: Payer: Self-pay | Admitting: Cardiovascular Disease

## 2019-07-26 ENCOUNTER — Ambulatory Visit: Payer: Medicare Other | Admitting: Cardiovascular Disease

## 2019-07-26 ENCOUNTER — Other Ambulatory Visit: Payer: Self-pay

## 2019-07-26 VITALS — BP 118/72 | HR 70 | Ht 73.0 in | Wt 223.5 lb

## 2019-07-26 DIAGNOSIS — Z7901 Long term (current) use of anticoagulants: Secondary | ICD-10-CM

## 2019-07-26 DIAGNOSIS — I482 Chronic atrial fibrillation, unspecified: Secondary | ICD-10-CM

## 2019-07-26 NOTE — Progress Notes (Signed)
Adam Parrish Date of Birth  Nov 14, 1945 [place] [address]    [place]   [place], [place]  [address]    [place], [place]  [address] [phone]  Fax  [phone]  [phone]  Fax [phone]  Problem LIst: 1. Atrial Fibrillation 2.  Chronic anticoagulation   Adam Parrish is a 73 y.o.  Man with chronic atrial fib   Dec. 23, 2014:  Adam Parrish is doing well.  Exercising regularly.  No CP or dyspnea.  Stable AF with well-controlled ventricular response. He remains on Coumadin.  He has his Coumadin levels checked through Dr. Eddie Parrish office. His INR levels are well controlled.  Dec. 17, 2015:  Adam Parrish is doing well.  He is seen for follow up of his atrial fib. Typical aches and pain. No CP and dyspnea.  Dec. 19, 2016:  Adam Parrish is doing ok from a cardiac standpoint. No Cp or dyspnea.  Tore his right ham string - while at work . INR [facility].   Dec. 13, 2017:  Adam Parrish is seen with wife Adam Parrish.  No CP or dyspnea.    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  On chronic coumadin - managed by Dr. Rex Parrish .   August 04, 2017:  Feeling well.    Has had a cold for 2 weeks.  No CP or dyspnea.  June 30, 2018: Adam Parrish is seen today as a work in visit.  He was at his primary medical doctor's office yesterday and complained of having some episodes of chest pain.  He has a history of chronic atrial fibrillation.  We was at work on Monday  Picked up a large roll of wire ( 125 lbs)  Got dizzy  Did not feel well for the rest of the work day  [employer] , took a nap and felt better  Has a constant chest pressure .  The pressure seemed to be relieved wth lifting his arm up   Dec. 18, 2019:  Adam Parrish is seen today for follow-up of an episode of chest pain that he had a month or so ago.  We performed a stress Myoview study.  He had no evidence of ischemia.  The ejection fraction was 45%.  No  dyspnea.  No further CP .   Dec. 8, 2020  Adam Parrish is seen  today for follow-up of his chronic atrial fibrillation.  He said some chest pains in the past. Has rare chest wall pain .    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  VS look good   Current Outpatient Medications on File Prior to Visit  Medication Sig Dispense Refill  . apixaban (ELIQUIS) 5 MG TABS tablet Take 1 tablet (5 mg total) by mouth 2 (two) times daily. 180 tablet 2  . diptheria-tetanus toxoids (DECAVAC) 2-2 LF/0.5ML injection Inject into the muscle once.    Marland Kitchen glucosamine-chondroitin 500-400 MG tablet Take 1 tablet by mouth 2 (two) times daily.    . Methylsulfonylmethane (MSM PO) Take by mouth. 1/3 TSP DAILY    . metoprolol tartrate (LOPRESSOR) 50 MG tablet TAKE 1 TABLET BY MOUTH TWICE DAILY 180 tablet 1  . Multiple Vitamin (CALCIUM COMPLEX PO) Take 1 tablet by mouth daily.    . nitroGLYCERIN (NITROSTAT) 0.4 MG SL tablet Place 1 tablet under the tongue daily as needed for chest pain.    Marland Kitchen  Tadalafil (CIALIS) 2.5 MG TABS Take 1 tablet (2.5 mg total) by mouth as needed. 20 each 0   No current facility-administered medications on file prior to visit.     Allergies  Allergen Reactions  . Shellfish Allergy Nausea And Vomiting, Swelling and Rash    Past Medical History:  Diagnosis Date  . Atrial fibrillation (HCC)    Chronic   . Chronic anticoagulation    followed at [facility]  . Erectile dysfunction     Past Surgical History:  Procedure Laterality Date  . CARDIOVASCULAR STRESS TEST  10-15-2005   EF 54%  . CIRCUMCISION REVISION  1986  . US ECHOCARDIOGRAPHY  07-24-2004   EF 55-60%  . VASECTOMY REVERSAL  1986    Social History   Tobacco Use  Smoking Status Never Smoker  Smokeless Tobacco Never Used    Social History   Substance and Sexual Activity  Alcohol Use Yes   Comment: Occas.    Family History  Problem Relation Age of Onset  . Alzheimer's disease Mother   . Prostate cancer  Father     Reviw of Systems:  Reviewed in the HPI.  All other systems are negative.  Physical Exam: There were no vitals taken for this visit.  GEN:  Well nourished, well developed in no acute distress HEENT: Normal NECK: No JVD; No carotid bruits LYMPHATICS: No lymphadenopathy CARDIAC: Irreg.   Irreg.   RESPIRATORY:  Clear to auscultation without rales, wheezing or rhonchi  ABDOMEN: Soft, non-tender, non-distended MUSCULOSKELETAL:  No edema; No deformity  SKIN: Warm and dry NEUROLOGIC:  Alert and oriented x 3     ECG: Dec. 8, 2020 :  Atrial Fib, occas. PVC    Assessment / Plan:   1.  Chest pain :   for further episodes of CP   1. Atrial Fibrillation:   Stable, chronic .  No symptoms .  Rate is well controlled.  Continue Eliquis.  2.  Chronic anticoagulation -he is doing very well on Eliquis.  Continue current medications.  His basic metabolic profile and his hemoglobin was stable when checked by his primary medical doctor last year.    Adam Moores, MD  07/26/2019 8:08 AM    [address],  [place], [place]  [address] Pager [phone] Phone: [phone]; Fax: [phone]

## 2019-07-26 NOTE — Patient Instructions (Signed)
Medication Instructions:  Your physician recommends that you continue on your current medications as directed. Please refer to the Current Medication list given to you today.  *If you need a refill on your cardiac medications before your next appointment, please call your pharmacy*  Lab Work: None Ordered    Testing/Procedures: None Ordered   Follow-Up: At [facility], you and your health needs are our priority.  As part of our continuing mission to provide you with exceptional heart care, we have created designated Provider Care Teams.  These Care Teams include your primary Cardiologist (physician) and Advanced Practice Providers (APPs -  Physician Assistants and Nurse Practitioners) who all work together to provide you with the care you need, when you need it.  Your next appointment:   1 year(s)  The format for your next appointment:   In Person  Provider:   You may see [name], MD or one of the following Advanced Practice Providers on your designated Care Team:    Richardson Dopp, PA-C  [place], [place]  Daune Perch, [place]

## 2019-09-01 ENCOUNTER — Other Ambulatory Visit: Payer: Self-pay | Admitting: *Deleted

## 2019-09-01 MED ORDER — APIXABAN 5 MG PO TABS: 5.0000 mg | ORAL_TABLET | Freq: Two times a day (BID) | ORAL | 1 refills | Status: DC

## 2019-09-01 NOTE — Telephone Encounter (Signed)
Prescription refill request for Eliquis received.  Last office visit: Nahser, 07/26/2019 Scr: 0.83, via [facility] 09-01-2018 Age: 74 y.o. Weight: 101.4 kg  Prescription refill sent.

## 2019-09-03 ENCOUNTER — Other Ambulatory Visit: Payer: Self-pay | Admitting: Student

## 2019-09-03 MED ORDER — APIXABAN 5 MG PO TABS: 5.0000 mg | ORAL_TABLET | Freq: Two times a day (BID) | ORAL | 3 refills | Status: DC

## 2019-10-18 ENCOUNTER — Other Ambulatory Visit: Payer: Self-pay

## 2019-10-18 MED ORDER — METOPROLOL TARTRATE 50 MG PO TABS: 50.0000 mg | ORAL_TABLET | Freq: Two times a day (BID) | ORAL | 3 refills | Status: DC

## 2020-02-18 LAB — BASIC METABOLIC PANEL
BUN: 17 (ref 4–21)
CO2: 23 — AB (ref 13–22)
Chloride: 99 (ref 99–108)
Creatinine: 1.1 (ref 0.6–1.3)
Glucose: 82
Potassium: 4.3 (ref 3.4–5.3)
Sodium: 137 (ref 137–147)

## 2020-02-18 LAB — COMPREHENSIVE METABOLIC PANEL
Albumin: 4.4 (ref 3.5–5.0)
Calcium: 9.3 (ref 8.7–10.7)
GFR calc Af Amer: 80
GFR calc non Af Amer: 69

## 2020-02-18 LAB — HEMOGLOBIN A1C: Hemoglobin A1C: 5.5

## 2020-02-18 LAB — LIPID PANEL
Cholesterol: 161 (ref 0–200)
HDL: 64 (ref 35–70)
Triglycerides: 51 (ref 40–160)

## 2020-02-18 LAB — HEPATIC FUNCTION PANEL
ALT: 28 (ref 10–40)
AST: 27 (ref 14–40)
Alkaline Phosphatase: 78 (ref 25–125)
Bilirubin, Total: 1

## 2020-02-18 LAB — CBC: RBC: 4.69 (ref 3.87–5.11)

## 2020-02-18 LAB — CBC AND DIFFERENTIAL
HCT: 42 (ref 41–53)
Hemoglobin: 14.7 (ref 13.5–17.5)
Platelets: 198 (ref 150–399)
WBC: 8.7

## 2020-02-18 LAB — TSH: TSH: 2.69 (ref 0.41–5.90)

## 2020-05-30 ENCOUNTER — Telehealth: Payer: Self-pay | Admitting: *Deleted

## 2020-05-30 NOTE — Telephone Encounter (Signed)
CORRECTION: Per Dr. Acie Fredrickson pt does NOT need a visit for pre op.

## 2020-05-30 NOTE — Telephone Encounter (Signed)
Patient with diagnosis of afib on Eliquis for anticoagulation.    Procedure: OSSEOUS SURGERY; CONFIRMED THERE ARE NO TEETH TO BE EXTRACTED Date of procedure: TBD  CHADS2-VASc score of 1 (age).  CrCl 72mL/min Platelet count 198K  Per office protocol, patient can hold Eliquis for 1-2 days prior to procedure.

## 2020-05-30 NOTE — Telephone Encounter (Signed)
Pt is agreeable to pre op appt needed. Pt has been scheduled to see Dr. Acie Fredrickson 10/15 @ 2:20. Pt thanked me for the call and the help. Will remove from the pre op call back. Will send FYI to requesting office pt has appt 06/01/20.

## 2020-05-30 NOTE — Telephone Encounter (Signed)
Per Dr. Acie Fredrickson pt does need a visit for pre op. he has been seen this year and pharmacy has given the recommendations on anticoag. he is at low risk.    Per Dr. Acie Fredrickson to cancel the 06/01/20 appt with him, as well as block the time slot.  I have s/w the pt and notified the pt he is not going to need the appt Friday 10/15 and the appt has been cancelled. I assured the pt once we have final clearance notes we will fax them to the surgeon's office. Pt thanked me for the call and the help.

## 2020-05-30 NOTE — Telephone Encounter (Signed)
° °  [facility] Pre-operative Risk Assessment    HEARTCARE STAFF: - Please ensure there is not already an duplicate clearance open for this procedure. - Under Visit Info/Reason for Call, type in Other and utilize the format Clearance MM/DD/YY or Clearance TBD. Do not use dashes or single digits. - If request is for dental extraction, please clarify the # of teeth to be extracted.  Request for surgical clearance:  1. What type of surgery is being performed? OSSEOUS SURGERY; CONFIRMED THERE ARE NO TEETH TO BE EXTRACTED  2. When is this surgery scheduled? TBD   3. What type of clearance is required (medical clearance vs. Pharmacy clearance to hold med vs. Both)? BOTH  4. Are there any medications that need to be held prior to surgery and how long? ELIQUIS   5. Practice name and name of physician performing surgery?  [facility]; DR. Junita Push   6. What is the office phone number? [phone]   7.   What is the office fax number? [phone]  8.   Anesthesia type (None, local, MAC, general) ? LOCAL   Julaine Hua 05/30/2020, 2:32 PM  _________________________________________________________________   (provider comments below)

## 2020-05-30 NOTE — Telephone Encounter (Signed)
Primary Cardiologist:Philip Nahser, MD  Chart reviewed as part of pre-operative protocol coverage. Because of ARUL FARABEE past medical history and time since last visit, he/she will require a follow-up visit in order to better assess preoperative cardiovascular risk.  Pre-op covering staff: - Please schedule appointment and call patient to inform them. - Please contact requesting surgeon's office via preferred method (i.e, phone, fax) to inform them of need for appointment prior to surgery.  If applicable, this message will also be routed to pharmacy pool and/or primary cardiologist for input on holding anticoagulant/antiplatelet agent as requested below so that this information is available at time of patient's appointment.   Deberah Pelton, NP  05/30/2020, 3:20 PM

## 2020-06-01 ENCOUNTER — Ambulatory Visit: Payer: Medicare Other | Admitting: Cardiovascular Disease

## 2020-06-04 NOTE — Telephone Encounter (Signed)
Patient's wife is returning call. 

## 2020-06-04 NOTE — Telephone Encounter (Signed)
   Primary Cardiologist: [name], MD  Chart reviewed as part of pre-operative protocol coverage. Patient was contacted 06/04/2020 in reference to pre-operative risk assessment for pending surgery as outlined below.  Adam Parrish was last seen on 07/26/19 by Dr. Acie Fredrickson.  Since that day, Adam Parrish has done well.  He can complete more than 4.0 METS without angina.    Per our clinical pharmacist: Patient with diagnosis of afib on Eliquis for anticoagulation.    Procedure: OSSEOUS SURGERY; CONFIRMED THERE ARE NO TEETH TO BE EXTRACTED Date of procedure: TBD  CHADS2-VASc score of 1 (age).  CrCl 17mL/min Platelet count 198K  Per office protocol, patient can hold Eliquis for 1-2 days prior to procedure.    Therefore, based on ACC/AHA guidelines, the patient would be at acceptable risk for the planned procedure without further cardiovascular testing.   The patient was advised that if he develops new symptoms prior to surgery to contact our office to arrange for a follow-up visit, and he verbalized understanding.  I will route this recommendation to the requesting party via Epic fax function and remove from pre-op pool. Please call with questions.  Adam Lin Taegen Lennox, PA 06/04/2020, 8:52 AM

## 2020-08-19 NOTE — Progress Notes (Signed)
This encounter was created in error - please disregard.

## 2020-08-21 ENCOUNTER — Encounter: Payer: Medicare Other | Admitting: Cardiovascular Disease

## 2020-08-28 ENCOUNTER — Other Ambulatory Visit: Payer: Self-pay | Admitting: Pharmacist

## 2020-08-28 MED ORDER — APIXABAN 5 MG PO TABS: 5.0000 mg | ORAL_TABLET | Freq: Two times a day (BID) | ORAL | 1 refills | Status: DC

## 2020-09-17 ENCOUNTER — Encounter: Payer: Self-pay | Admitting: Cardiovascular Disease

## 2020-09-17 NOTE — Progress Notes (Signed)
Adam Parrish Date of Birth  11/28/1945 [place] [address]    [place]   [place], [place]  [address]    [place], [place]  [address] [phone]  Fax  [phone]  [phone]  Fax [phone]  Problem LIst: 1. Atrial Fibrillation 2.  Chronic anticoagulation   Adam Parrish is a 76 y.o.  Man with chronic atrial fib   Dec. 23, 2014:  Adam Parrish is doing well.  Exercising regularly.  No CP or dyspnea.  Stable AF with well-controlled ventricular response. He remains on Coumadin.  He has his Coumadin levels checked through Dr. Eddie Parrish office. His INR levels are well controlled.  Dec. 17, 2015:  Adam Parrish is doing well.  He is seen for follow up of his atrial fib. Typical aches and pain. No CP and dyspnea.  Dec. 19, 2016:  Adam Parrish is doing ok from a cardiac standpoint. No Cp or dyspnea.  Tore his right ham string - while at work . INR is checked at [facility].   Dec. 13, 2017:  Adam Parrish is seen with wife Adam Parrish.  No CP or dyspnea.    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  On chronic coumadin - managed by Dr. Rex Parrish .   August 04, 2017:  Feeling well.    Has had a cold for 2 weeks.  No CP or dyspnea.  June 30, 2018: Adam Parrish is seen today as a work in visit.  He was at his primary medical doctor's office yesterday and complained of having some episodes of chest pain.  He has a history of chronic atrial fibrillation.  We was at work on Monday  Picked up a large roll of wire ( 125 lbs)  Got dizzy  Did not feel well for the rest of the work day  [employer] , took a nap and felt better  Has a constant chest pressure .  The pressure seemed to be relieved wth lifting his arm up   Dec. 18, 2019:  Adam Parrish is seen today for follow-up of an episode of chest pain that he had a month or so ago.  We performed a stress Myoview study.  He had no evidence of ischemia.  The ejection fraction was 45%.  No  dyspnea.  No further CP .   Dec. 8, 2020  Adam Parrish is seen  today for follow-up of his chronic atrial fibrillation.  He said some chest pains in the past. Has rare chest wall pain .    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  VS look good   Feb. 1, 2022: Adam Parrish is seen today for follow up of his chronic Afib. Is very busy,  Is a handiman now  No CP ,    Adam Parrish , his wife , has heard of generic Apixaban and he would like a prescription for this  ( I've just heard that the generic med has been approved but is not available in pharmacies yet )    Current Outpatient Medications on File Prior to Visit  Medication Sig Dispense Refill  . apixaban (ELIQUIS) 5 MG TABS tablet Take 1 tablet (5 mg total) by mouth 2 (two) times daily. 180 tablet 1  . glucosamine-chondroitin 500-400 MG tablet Take 1 tablet by mouth 2 (two) times daily.    . Methylsulfonylmethane (MSM PO) Take by mouth. 1/3 TSP DAILY    .  metoprolol tartrate (LOPRESSOR) 50 MG tablet Take 1 tablet (50 mg total) by mouth 2 (two) times daily. 180 tablet 3  . Misc Natural Products (SAW [place] PLUS) CAPS Take 1 capsule by mouth daily at 12 noon.    . Multiple Vitamin (CALCIUM COMPLEX PO) Take 1 tablet by mouth daily.    . nitroGLYCERIN (NITROSTAT) 0.4 MG SL tablet Place 1 tablet under the tongue daily as needed for chest pain.    . Tadalafil (CIALIS) 2.5 MG TABS Take 1 tablet (2.5 mg total) by mouth as needed. 20 each 0   No current facility-administered medications on file prior to visit.    Allergies  Allergen Reactions  . Shellfish Allergy Nausea And Vomiting, Swelling and Rash    Past Medical History:  Diagnosis Date  . Atrial fibrillation (HCC)    Chronic   . Chronic anticoagulation    followed at [facility]  . Erectile dysfunction     Past Surgical History:  Procedure Laterality Date  . CARDIOVASCULAR STRESS TEST  10-15-2005   EF 54%  . CIRCUMCISION REVISION  1986  . US ECHOCARDIOGRAPHY  07-24-2004   EF  55-60%  . VASECTOMY REVERSAL  1986    Social History   Tobacco Use  Smoking Status Never Smoker  Smokeless Tobacco Never Used    Social History   Substance and Sexual Activity  Alcohol Use Yes   Comment: Occas.    Family History  Problem Relation Age of Onset  . Alzheimer's disease Mother   . Prostate cancer Father     Reviw of Systems:  Reviewed in the HPI.  All other systems are negative.  Physical Exam: Blood pressure 112/76, pulse 75, height 6\' 1"  (1.854 m), weight 229 lb (103.9 kg), SpO2 95 %.  GEN:  Well nourished, well developed in no acute distress HEENT: Normal NECK: No JVD; No carotid bruits LYMPHATICS: No lymphadenopathy CARDIAC: irreg. Irreg. , no murmurs, rubs, gallops RESPIRATORY:  Clear to auscultation without rales, wheezing or rhonchi  ABDOMEN: Soft, non-tender, non-distended MUSCULOSKELETAL:  No edema; No deformity  SKIN: Warm and dry NEUROLOGIC:  Alert and oriented x 3      ECG: 09/18/2020: A. fib with controlled ventricular response - 75 bpm   No changes from previous EKG.   Assessment / Plan:     1. Atrial Fibrillation:      Has chronic Afib.    HR is very stable .   2.  Chronic anticoagulation - .cont Eliquis.   Generic Apixaban has been approaved but is not available in [place] pharmacies at this point .     [name], MD  09/18/2020 5:32 PM    [facility] [place],  [place], [place]  [address] Pager [phone] Phone: [phone]; Fax: [phone]

## 2020-09-18 ENCOUNTER — Telehealth: Payer: Self-pay | Admitting: *Deleted

## 2020-09-18 ENCOUNTER — Encounter: Payer: Self-pay | Admitting: Cardiovascular Disease

## 2020-09-18 ENCOUNTER — Ambulatory Visit: Payer: Medicare Other | Admitting: Cardiovascular Disease

## 2020-09-18 ENCOUNTER — Other Ambulatory Visit: Payer: Self-pay

## 2020-09-18 VITALS — BP 112/76 | HR 75 | Ht 73.0 in | Wt 229.0 lb

## 2020-09-18 DIAGNOSIS — I482 Chronic atrial fibrillation, unspecified: Secondary | ICD-10-CM | POA: Diagnosis not present

## 2020-09-18 MED ORDER — APIXABAN 5 MG PO TABS: 5.0000 mg | ORAL_TABLET | Freq: Two times a day (BID) | ORAL | 3 refills | Status: DC

## 2020-09-18 NOTE — Patient Instructions (Addendum)
  Medication Instructions:  No changes *If you need a refill on your cardiac medications before your next appointment, please call your pharmacy*   Lab Work: none If you have labs (blood work) drawn today and your tests are completely normal, you will receive your results only by: Marland Kitchen MyChart Message (if you have MyChart) OR . A paper copy in the mail If you have any lab test that is abnormal or we need to change your treatment, we will call you to review the results.   Testing/Procedures: none   Follow-Up: At [facility], you and your health needs are our priority.  As part of our continuing mission to provide you with exceptional heart care, we have created designated Provider Care Teams.  These Care Teams include your primary Cardiologist (physician) and Advanced Practice Providers (APPs -  Physician Assistants and Nurse Practitioners) who all work together to provide you with the care you need, when you need it.  We recommend signing up for the patient portal called "MyChart".  Sign up information is provided on this After Visit Summary.  MyChart is used to connect with patients for Virtual Visits (Telemedicine).  Patients are able to view lab/test results, encounter notes, upcoming appointments, etc.  Non-urgent messages can be sent to your provider as well.   To learn more about what you can do with MyChart, go to [email or web address].    Your next appointment:   12 month(s)  The format for your next appointment:   In Person  Provider:   You may see [name], MD or one of the following Advanced Practice Providers on your designated Care Team:    Richardson Dopp, PA-C  Robbie Lis, [place]  Other Instructions

## 2020-09-18 NOTE — Telephone Encounter (Signed)
Patient needs assistant with prior authorization for generic Eliqus for husband

## 2020-09-19 NOTE — Telephone Encounter (Signed)
There is no generic for Eliquis. It will only be available as a brand for at least the next 5 years. Called pt and spoke with his wife, I also received a separate message about generic Eliquis for her.  Advised her that there is no generic for Eliquis. She states her brother takes it in [place] and his copay is only $21. Advised pt that that is a normal copay for someone with commercial insurance who uses a copay card. Her copay is $47 per month. Not sure if there is confusion as a Chief Strategy Officer has been approved to make Eliquis, however they cannot market this until the patent expires which will not be for at least 5 years.

## 2020-10-03 ENCOUNTER — Other Ambulatory Visit: Payer: Self-pay

## 2020-10-03 MED ORDER — APIXABAN 5 MG PO TABS: 5.0000 mg | ORAL_TABLET | Freq: Two times a day (BID) | ORAL | 1 refills | Status: DC

## 2020-10-03 MED ORDER — METOPROLOL TARTRATE 50 MG PO TABS: 50.0000 mg | ORAL_TABLET | Freq: Two times a day (BID) | ORAL | 3 refills | Status: DC

## 2020-10-03 NOTE — Telephone Encounter (Signed)
Prescription refill request for Eliquis received.  Indication: Afib Last office visit: Nahser, 09/18/2020 Scr: 1.06, 02/17/2020 Age: 75 yo  Weight: 103.9 kg   Prescription refill sent.    Eliquis showing up twice on pt's med list. Will d/c one so it is only showing up once on pt's med list. Prescription refill sent.

## 2020-10-10 ENCOUNTER — Telehealth: Payer: Self-pay | Admitting: Cardiovascular Disease

## 2020-10-10 NOTE — Telephone Encounter (Signed)
    Pt would like to remove [facility] to her preferred pharmacies. She said only to send all her prescription to [place], [place], [address] and any emergency refill send to [id number] Adam Parrish, [place]

## 2020-10-10 NOTE — Telephone Encounter (Signed)
Pharmacy preference has been updated to the following per patient request:  [place], [place], [address]

## 2020-10-11 ENCOUNTER — Other Ambulatory Visit: Payer: Self-pay

## 2020-10-11 ENCOUNTER — Emergency Department
Admission: EM | Admit: 2020-10-11 | Discharge: 2020-10-11 | Disposition: A | Discharge status: Home / Self Care | Payer: Medicare Other | Source: Home / Self Care | Attending: Family Medicine | Admitting: Family Medicine

## 2020-10-11 ENCOUNTER — Emergency Department (INDEPENDENT_AMBULATORY_CARE_PROVIDER_SITE_OTHER): Payer: Medicare Other

## 2020-10-11 ENCOUNTER — Encounter: Payer: Self-pay | Admitting: Emergency Medicine

## 2020-10-11 DIAGNOSIS — I499 Cardiac arrhythmia, unspecified: Secondary | ICD-10-CM | POA: Insufficient documentation

## 2020-10-11 DIAGNOSIS — J189 Pneumonia, unspecified organism: Secondary | ICD-10-CM | POA: Diagnosis not present

## 2020-10-11 DIAGNOSIS — I517 Cardiomegaly: Secondary | ICD-10-CM

## 2020-10-11 DIAGNOSIS — R059 Cough, unspecified: Secondary | ICD-10-CM | POA: Diagnosis not present

## 2020-10-11 DIAGNOSIS — L570 Actinic keratosis: Secondary | ICD-10-CM | POA: Insufficient documentation

## 2020-10-11 DIAGNOSIS — I4891 Unspecified atrial fibrillation: Secondary | ICD-10-CM | POA: Diagnosis not present

## 2020-10-11 DIAGNOSIS — I10 Essential (primary) hypertension: Secondary | ICD-10-CM | POA: Insufficient documentation

## 2020-10-11 MED ORDER — DOXYCYCLINE HYCLATE 100 MG PO CAPS: ORAL_CAPSULE | ORAL | 0 refills | Status: DC

## 2020-10-11 NOTE — Discharge Instructions (Addendum)
Take plain guaifenesin (1200mg extended release tabs such as Mucinex) twice daily, with plenty of water, for cough and congestion.    If symptoms become significantly worse during the night or over the weekend, proceed to the local emergency room.   

## 2020-10-11 NOTE — ED Triage Notes (Signed)
Intermittent cough since 09/23/20 No COVID vaccine  No fever or chills Per pt "my wife is worried I have fluid on my lungs because she had it after her cardioversion" Pt denies DOE Pt has never had a COVID test

## 2020-10-11 NOTE — ED Provider Notes (Signed)
Adam Parrish CARE    CSN: [id number] Arrival date & time: 10/11/20  1833      History   Chief Complaint Chief Complaint  Patient presents with  . Cough    HPI Adam Parrish is a 75 y.o. male.   Patient complains of an intermittent persistent cough for about 2.5 weeks.  He denies sore throat, nasal congestion, fever, fatigue, pleuritic pain, and shortness of breath.  He has atrial fib, presently on Eliquis.  The history is provided by the patient.    Past Medical History:  Diagnosis Date  . Atrial fibrillation (HCC)    Chronic   . Chronic anticoagulation    followed at [facility]  . Erectile dysfunction     Patient Active Problem List   Diagnosis Date Noted  . Actinic keratosis 10/11/2020  . Essential hypertension 10/11/2020  . Irregular heart beat 10/11/2020  . Erectile dysfunction 04/16/2011  . Atrial fibrillation ([place]) 11/21/2010    Past Surgical History:  Procedure Laterality Date  . CARDIOVASCULAR STRESS TEST  10-15-2005   EF 54%  . CIRCUMCISION REVISION  1986  . US ECHOCARDIOGRAPHY  07-24-2004   EF 55-60%  . [place] Medications    Prior to Admission medications   Medication Sig Start Date End Date Taking? Authorizing Provider  apixaban (ELIQUIS) 5 MG TABS tablet Take 1 tablet (5 mg total) by mouth 2 (two) times daily. 10/03/20  Yes Nahser, Wonda Cheng, MD  doxycycline (VIBRAMYCIN) 100 MG capsule Take one cap PO Q12hr with food. 10/11/20  Yes Kandra Nicolas, MD  glucosamine-chondroitin 500-400 MG tablet Take 1 tablet by mouth 2 (two) times daily.   Yes [provider]  Methylsulfonylmethane (MSM PO) Take by mouth. 1/3 TSP DAILY   Yes [provider]  metoprolol tartrate (LOPRESSOR) 50 MG tablet Take 1 tablet (50 mg total) by mouth 2 (two) times daily. 10/03/20  Yes Nahser, Wonda Cheng, MD  Misc Natural Products (SAW PALMETTO PLUS) CAPS Take 1 capsule by mouth daily at 12 noon.   Yes [provider]  Multiple Vitamin (CALCIUM COMPLEX PO) Take 1 tablet by mouth daily.   Yes [provider]  nitroGLYCERIN (NITROSTAT) 0.4 MG SL tablet Place 1 tablet under the tongue daily as needed for chest pain. 06/29/18   [provider]  Tadalafil (CIALIS) 2.5 MG TABS Take 1 tablet (2.5 mg total) by mouth as needed. 06/23/14   Nahser, Wonda Cheng, MD    Family History Family History  Problem Relation Age of Onset  . Alzheimer's disease Mother   . Prostate cancer Father     Social History Social History   Tobacco Use  . Smoking status: Never Smoker  . Smokeless tobacco: Never Used  Vaping Use  . Vaping Use: Never used  Substance Use Topics  . Alcohol use: Yes    Comment: Occas.     Allergies   Shellfish allergy   Review of Systems Review of Systems No sore throat + cough No pleuritic pain No wheezing No nasal congestion No post-nasal drainage No sinus pain/pressure No itchy/red eyes No earache No hemoptysis No SOB No fever/chills No nausea No vomiting No abdominal pain No diarrhea No urinary symptoms No skin rash No fatigue No myalgias No headache    Physical Exam Triage Vital Signs ED Triage Vitals  Enc Vitals Group     BP 10/11/20 1846 (!) 133/97     Pulse Rate  10/11/20 1846 (!) 105     Resp 10/11/20 1846 17     Temp 10/11/20 1846 98.6 F (37 C)     Temp Source 10/11/20 1846 Oral     SpO2 10/11/20 1846 96 %     Weight 10/11/20 1847 213 lb (96.6 kg)     Height 10/11/20 1847 6\' 1"  (1.854 m)     Head Circumference --      Peak Flow --      Pain Score 10/11/20 1847 0     Pain Loc --      Pain Edu? --      Excl. in [place]? --    No data found.  Updated Vital Signs BP (!) 133/97 (BP Location: Right Arm)   Pulse (!) 105   Temp 98.6 F (37 C) (Oral)   Resp 17   Ht 6\' 1"  (1.854 m)   Wt 96.6 kg   SpO2 96%   BMI 28.10 kg/m   Visual Acuity Right Eye Distance:   Left Eye Distance:   Bilateral Distance:    Right Eye Near:   Left Eye  Near:    Bilateral Near:     Physical Exam Nursing notes and Vital Signs reviewed. Appearance:  Patient appears stated age, and in no acute distress Eyes:  Pupils are equal, round, and reactive to light and accomodation.  Extraocular movement is intact.  Conjunctivae are not inflamed  Ears:  Canals normal.  Tympanic membranes normal.  Nose:   Normal turbinates.  No sinus tenderness.  Pharynx:  Normal Neck:  Supple. No adenopathy. Lungs:  Clear to auscultation.  Breath sounds are equal.  Moving air well. Heart:   Irregularly irregular rhythm without murmurs, rubs, or gallops.  Abdomen:  Nontender without masses or hepatosplenomegaly.  Bowel sounds are present.  No CVA or flank tenderness.  Extremities:  No edema.  Skin:  No rash present.   UC Treatments / Results  Labs (all labs ordered are listed, but only abnormal results are displayed) Labs Reviewed - No data to display  EKG   Radiology DG Chest 2 View  Result Date: 10/11/2020 CLINICAL DATA:  Persistent intermittent cough for 2.5 weeks. History of atrial fib EXAM: CHEST - 2 VIEW COMPARISON:  11/13/2016 and prior. FINDINGS: No pneumothorax or pleural effusion. Mild cardiomegaly. New patchy bilateral mid to lower lung opacities. No acute osseous abnormality. IMPRESSION: Patchy bilateral opacities suspicious for infection. Mild cardiomegaly. Electronically Signed   By: Primitivo Gauze M.D.   On: 10/11/2020 19:34    Procedures Procedures (including critical care time)  Medications Ordered in UC Medications - No data to display  Initial Impression / Assessment and Plan / UC Course  I have reviewed the triage vital signs and the nursing notes.  Pertinent labs & imaging results that were available during my care of the patient were reviewed by me and considered in my medical decision making (see chart for details).    Begin doxycycline 100mg  BID. Followup with Family Doctor if not improved in about 10 days for repeat chest  x-ray.   Final Clinical Impressions(s) / UC Diagnoses   Final diagnoses:  Community acquired pneumonia of both lungs     Discharge Instructions     Take plain guaifenesin (1200mg  extended release tabs such as Mucinex) twice daily, with plenty of water, for cough and congestion.   If symptoms become significantly worse during the night or over the weekend, proceed to the local emergency room.  ED Prescriptions    Medication Sig Dispense Auth. Provider   doxycycline (VIBRAMYCIN) 100 MG capsule Take one cap PO Q12hr with food. 20 capsule Kandra Nicolas, MD        Kandra Nicolas, MD 10/11/20 2009

## 2020-12-20 ENCOUNTER — Ambulatory Visit (INDEPENDENT_AMBULATORY_CARE_PROVIDER_SITE_OTHER): Payer: Medicare Other | Admitting: Family Medicine

## 2020-12-20 ENCOUNTER — Encounter: Payer: Self-pay | Admitting: Family Medicine

## 2020-12-20 ENCOUNTER — Other Ambulatory Visit: Payer: Self-pay

## 2020-12-20 DIAGNOSIS — I10 Essential (primary) hypertension: Secondary | ICD-10-CM | POA: Diagnosis not present

## 2020-12-20 DIAGNOSIS — I482 Chronic atrial fibrillation, unspecified: Secondary | ICD-10-CM | POA: Diagnosis not present

## 2020-12-20 DIAGNOSIS — D6869 Other thrombophilia: Secondary | ICD-10-CM

## 2020-12-20 LAB — HEPATITIS C ANTIBODY: Hep C Virus Ab: <0.1

## 2020-12-20 NOTE — Progress Notes (Signed)
Adam Parrish - 75 y.o. male MRN [id number]  Date of birth: 02-24-1946  Subjective Chief Complaint  Patient presents with  . Establish Care    HPI Adam Parrish is a 75 y.o. male here today for initial visit to establish care.  He has history of HTN and Atrial fibrillation.  He sees cardiology for management of these.  BP and rate controlled with metoprolol 50mg  BID.   He is anticoagulated with eliquis, tolerating well.  Some small bruises on arms but no bleeding noted.  He has no additional concerns today.  Plans to schedule annual exam in July.    ROS:  A comprehensive ROS was completed and negative except as noted per HPI  Allergies  Allergen Reactions  . Shellfish Allergy Nausea And Vomiting, Swelling and Rash    Past Medical History:  Diagnosis Date  . Atrial fibrillation (HCC)    Chronic   . Chronic anticoagulation    followed at [facility]  . Erectile dysfunction     Past Surgical History:  Procedure Laterality Date  . CARDIOVASCULAR STRESS TEST  10-15-2005   EF 54%  . CIRCUMCISION REVISION  1986  . US ECHOCARDIOGRAPHY  07-24-2004   EF 55-60%  . VASECTOMY REVERSAL  1986    Social History   Socioeconomic History  . Marital status: Married    Spouse name: Not on file  . Number of children: Not on file  . Years of education: Not on file  . Highest education level: Not on file  Occupational History  . Occupation: Self-Employed  Tobacco Use  . Smoking status: Former Smoker    Packs/day: 1.00    Years: 20.00    Pack years: 20.00    Types: Cigarettes    Quit date: 08/19/1975    Years since quitting: 45.3  . Smokeless tobacco: Never Used  Vaping Use  . Vaping Use: Never used  Substance and Sexual Activity  . Alcohol use: Yes    Alcohol/week: 7.0 standard drinks    Types: 7 Cans of beer per week    Comment: Daily  . Drug use: Never  . Sexual activity: Yes    Partners: Female  Other Topics Concern  . Not on file  Social History Narrative  . Not on  file   Social Determinants of Health   Financial Resource Strain: Not on file  Food Insecurity: Not on file  Transportation Needs: Not on file  Physical Activity: Not on file  Stress: Not on file  Social Connections: Not on file    Family History  Problem Relation Age of Onset  . Alzheimer's disease Mother   . Prostate cancer Father     Health Maintenance  Topic Date Due  . COVID-19 Vaccine (1) Never done  . COLONOSCOPY (Pts 45-70yrs Insurance coverage will need to be confirmed)  Never done  . INFLUENZA VACCINE  03/18/2021  . TETANUS/TDAP  07/16/2026  . Hepatitis C Screening  Completed  . PNA vac Low Risk Adult  Completed  . HPV VACCINES  Aged Out     ----------------------------------------------------------------------------------------------------------------------------------------------------------------------------------------------------------------- Physical Exam BP 120/87 (BP Location: Left Arm, Patient Position: Sitting, Cuff Size: Large)   Pulse 74   Temp 97.7 F (36.5 C)   Ht 6\' 1"  (1.854 m)   Wt 226 lb (102.5 kg)   SpO2 97%   BMI 29.82 kg/m   Physical Exam Constitutional:      Appearance: Normal appearance.  HENT:     Head: Normocephalic and atraumatic.  Eyes:     General: No scleral icterus. Cardiovascular:     Rate and Rhythm: Normal rate and regular rhythm.  Pulmonary:     Effort: Pulmonary effort is normal.     Breath sounds: Normal breath sounds.  Musculoskeletal:     Cervical back: Neck supple.  Neurological:     General: No focal deficit present.     Mental Status: He is alert.  Psychiatric:        Mood and Affect: Mood normal.        Behavior: Behavior normal.     ------------------------------------------------------------------------------------------------------------------------------------------------------------------------------------------------------------------- Assessment and Plan  Atrial fibrillation ([place]) Managed  by cardiology.  Stable at this time.  Rate controlled with metoprolol and anticoagulated with eliquis.   Essential hypertension Blood pressure is at goal at for age and co-morbidities.  I recommend continuation of metoprolol.  In addition they were instructed to follow a low sodium diet with regular exercise to help to maintain adequate control of blood pressure.    Secondary hypercoagulable state ([place]) On eliquis for stroke prevention related to A.fib   No orders of the defined types were placed in this encounter.   No follow-ups on file.    This visit occurred during the SARS-CoV-2 public health emergency.  Safety protocols were in place, including screening questions prior to the visit, additional usage of staff PPE, and extensive cleaning of exam room while observing appropriate contact time as indicated for disinfecting solutions.

## 2020-12-20 NOTE — Assessment & Plan Note (Addendum)
Managed by cardiology.  Stable at this time.  Rate controlled with metoprolol and anticoagulated with eliquis.

## 2020-12-20 NOTE — Assessment & Plan Note (Signed)
Blood pressure is at goal at for age and co-morbidities.  I recommend continuation of metoprolol.  In addition they were instructed to follow a low sodium diet with regular exercise to help to maintain adequate control of blood pressure.   

## 2020-12-20 NOTE — Assessment & Plan Note (Signed)
On eliquis for stroke prevention related to A.fib

## 2020-12-20 NOTE — Patient Instructions (Signed)
Great to meet you today! See me again for annual exam in July

## 2021-02-11 ENCOUNTER — Telehealth: Payer: Self-pay

## 2021-02-11 NOTE — Telephone Encounter (Signed)
Pt lvm requesting callback concerning his appointment.   Forwarding this message to [employer].

## 2021-02-12 NOTE — Telephone Encounter (Signed)
Taken care of. Patient stated his wife completed the request. No further questions at this time.

## 2021-03-05 ENCOUNTER — Encounter: Payer: Medicare Other | Admitting: Family Medicine

## 2021-03-06 ENCOUNTER — Other Ambulatory Visit: Payer: Self-pay | Admitting: Cardiovascular Disease

## 2021-03-06 NOTE — Telephone Encounter (Signed)
Prescription refill request for Eliquis received. Indication: AFIB  Last office visit: nahser, 09/18/2020 Scr: 1.1, 02/18/2020 Age: 74 yo  Weight: 102.5 kg

## 2021-03-12 ENCOUNTER — Other Ambulatory Visit: Payer: Self-pay

## 2021-03-12 ENCOUNTER — Ambulatory Visit (INDEPENDENT_AMBULATORY_CARE_PROVIDER_SITE_OTHER): Payer: Medicare Other | Admitting: Family Medicine

## 2021-03-12 ENCOUNTER — Encounter: Payer: Self-pay | Admitting: Family Medicine

## 2021-03-12 VITALS — BP 126/94 | HR 69 | Temp 97.6°F | Ht 73.0 in | Wt 225.0 lb

## 2021-03-12 DIAGNOSIS — Z13220 Encounter for screening for lipoid disorders: Secondary | ICD-10-CM | POA: Diagnosis not present

## 2021-03-12 DIAGNOSIS — I10 Essential (primary) hypertension: Secondary | ICD-10-CM

## 2021-03-12 DIAGNOSIS — N400 Enlarged prostate without lower urinary tract symptoms: Secondary | ICD-10-CM

## 2021-03-12 DIAGNOSIS — Z0000 Encounter for general adult medical examination without abnormal findings: Secondary | ICD-10-CM | POA: Insufficient documentation

## 2021-03-12 DIAGNOSIS — I482 Chronic atrial fibrillation, unspecified: Secondary | ICD-10-CM

## 2021-03-12 DIAGNOSIS — Z125 Encounter for screening for malignant neoplasm of prostate: Secondary | ICD-10-CM

## 2021-03-12 LAB — CBC WITH DIFFERENTIAL/PLATELET
Absolute Monocytes: 610 cells/uL (ref 200–950)
Basophils Absolute: 28 cells/uL (ref 0–200)
Basophils Relative: 0.5 %
Eosinophils Absolute: 151 cells/uL (ref 15–500)
Eosinophils Relative: 2.7 %
HCT: 42.6 % (ref 38.5–50.0)
Hemoglobin: 14 g/dL (ref 13.2–17.1)
Lymphs Abs: 2122 cells/uL (ref 850–3900)
MCH: 30.4 pg (ref 27.0–33.0)
MCHC: 32.9 g/dL (ref 32.0–36.0)
MCV: 92.6 fL (ref 80.0–100.0)
MPV: 10.7 fL (ref 7.5–12.5)
Monocytes Relative: 10.9 %
Neutro Abs: 2688 cells/uL (ref 1500–7800)
Neutrophils Relative %: 48 %
Platelets: 199 10*3/uL (ref 140–400)
RBC: 4.6 10*6/uL (ref 4.20–5.80)
RDW: 12.9 % (ref 11.0–15.0)
Total Lymphocyte: 37.9 %
WBC: 5.6 10*3/uL (ref 3.8–10.8)

## 2021-03-12 LAB — PSA: PSA: 0.25 ng/mL (ref <=4.00)

## 2021-03-12 LAB — COMPLETE METABOLIC PANEL WITH GFR
AG Ratio: 1.7 (calc) (ref 1.0–2.5)
ALT: 24 U/L (ref 9–46)
AST: 20 U/L (ref 10–35)
Albumin: 4 g/dL (ref 3.6–5.1)
Alkaline phosphatase (APISO): 61 U/L (ref 35–144)
BUN: 13 mg/dL (ref 7–25)
CO2: 29 mmol/L (ref 20–32)
Calcium: 8.9 mg/dL (ref 8.6–10.3)
Chloride: 105 mmol/L (ref 98–110)
Creat: 0.88 mg/dL (ref 0.70–1.28)
Globulin: 2.3 g/dL (calc) (ref 1.9–3.7)
Glucose, Bld: 100 mg/dL — ABNORMAL HIGH (ref 65–99)
Potassium: 4.7 mmol/L (ref 3.5–5.3)
Sodium: 138 mmol/L (ref 135–146)
Total Bilirubin: 0.8 mg/dL (ref 0.2–1.2)
Total Protein: 6.3 g/dL (ref 6.1–8.1)
eGFR: 90 mL/min/{1.73_m2} (ref >=60)

## 2021-03-12 LAB — LIPID PANEL W/REFLEX DIRECT LDL
Cholesterol: 150 mg/dL (ref <200)
HDL: 57 mg/dL (ref >=40)
LDL Cholesterol (Calc): 78 mg/dL (calc)
Non-HDL Cholesterol (Calc): 93 mg/dL (calc) (ref <130)
Total CHOL/HDL Ratio: 2.6 (calc) (ref <5.0)
Triglycerides: 66 mg/dL (ref <150)

## 2021-03-12 LAB — TSH: TSH: 3.35 mIU/L (ref 0.40–4.50)

## 2021-03-12 NOTE — Progress Notes (Signed)
Adam Parrish - 75 y.o. male MRN [id number]  Date of birth: 1945-09-20  Subjective Chief Complaint  Patient presents with   Annual Exam    HPI Adam Parrish is a 75 year old male here today for annual exam.  He has history of atrial fibrillation and is followed by cardiology.  Last visit with Dr. Acie Fredrickson and February of this year.  This is rate controlled with metoprolol and he is anticoagulated with apixaban.  He is tolerating these well.  He has had colonoscopy through [facility].  We will request records from this.  Tdap is up-to-date.  He does not want to have shingles or COVID vaccines.  Review of Systems  Constitutional:  Negative for chills, fever, malaise/fatigue and weight loss.  HENT:  Negative for congestion, ear pain and sore throat.   Eyes:  Negative for blurred vision, double vision and pain.  Respiratory:  Negative for cough and shortness of breath.   Cardiovascular:  Negative for chest pain and palpitations.  Gastrointestinal:  Negative for abdominal pain, blood in stool, constipation, heartburn and nausea.  Genitourinary:  Negative for dysuria and urgency.  Musculoskeletal:  Negative for joint pain and myalgias.  Neurological:  Negative for dizziness and headaches.  Endo/Heme/Allergies:  Does not bruise/bleed easily.  Psychiatric/Behavioral:  Negative for depression. The patient is not nervous/anxious and does not have insomnia.    Allergies  Allergen Reactions   Shellfish Allergy Nausea And Vomiting, Swelling and Rash    Past Medical History:  Diagnosis Date   Atrial fibrillation (HCC)    Chronic    Chronic anticoagulation    followed at [facility]   Erectile dysfunction     Past Surgical History:  Procedure Laterality Date   CARDIOVASCULAR STRESS TEST  10-15-2005   EF 54%   CIRCUMCISION REVISION  1986   US ECHOCARDIOGRAPHY  07-24-2004   EF 55-60%   VASECTOMY REVERSAL  1986    Social History   Socioeconomic History   Marital status: Married    Spouse  name: Not on file   Number of children: Not on file   Years of education: Not on file   Highest education level: Not on file  Occupational History   Occupation: Self-Employed  Tobacco Use   Smoking status: Former    Packs/day: 1.00    Years: 20.00    Pack years: 20.00    Types: Cigarettes    Quit date: 08/19/1975    Years since quitting: 45.5   Smokeless tobacco: Never  Vaping Use   Vaping Use: Never used  Substance and Sexual Activity   Alcohol use: Yes    Alcohol/week: 7.0 standard drinks    Types: 7 Cans of beer per week    Comment: Daily   Drug use: Never   Sexual activity: Yes    Partners: Female  Other Topics Concern   Not on file  Social History Narrative   Not on file   Social Determinants of Health   Financial Resource Strain: Not on file  Food Insecurity: Not on file  Transportation Needs: Not on file  Physical Activity: Not on file  Stress: Not on file  Social Connections: Not on file    Family History  Problem Relation Age of Onset   Alzheimer's disease Mother    Prostate cancer Father     Health Maintenance  Topic Date Due   Zoster Vaccines- Shingrix (1 of 2) Never done   COLONOSCOPY (Pts 45-84yr Insurance coverage will need to be confirmed)  Never done   COVID-19 Vaccine (2 - Pfizer risk series) 05/09/2020   INFLUENZA VACCINE  03/18/2021   TETANUS/TDAP  07/16/2026   Hepatitis C Screening  Completed   PNA vac Low Risk Adult  Completed   HPV VACCINES  Aged Out     ----------------------------------------------------------------------------------------------------------------------------------------------------------------------------------------------------------------- Physical Exam BP (!) 126/94 (BP Location: Left Arm, Patient Position: Sitting, Cuff Size: Large)   Pulse 69   Temp 97.6 F (36.4 C)   Ht '6\' 1"'$  (1.854 m)   Wt 225 lb (102.1 kg)   SpO2 97%   BMI 29.69 kg/m   Physical Exam Constitutional:      General: He is not in acute  distress. HENT:     Head: Normocephalic and atraumatic.     Right Ear: Tympanic membrane and external ear normal.     Left Ear: Tympanic membrane and external ear normal.  Eyes:     General: No scleral icterus. Neck:     Thyroid: No thyromegaly.  Cardiovascular:     Rate and Rhythm: Normal rate and regular rhythm.     Heart sounds: Normal heart sounds.  Pulmonary:     Effort: Pulmonary effort is normal.     Breath sounds: Normal breath sounds.  Abdominal:     General: Bowel sounds are normal. There is no distension.     Palpations: Abdomen is soft.     Tenderness: There is no abdominal tenderness. There is no guarding.  Musculoskeletal:     Cervical back: Normal range of motion.  Lymphadenopathy:     Cervical: No cervical adenopathy.  Skin:    General: Skin is warm and dry.     Findings: No rash.  Neurological:     Mental Status: He is alert and oriented to person, place, and time.     Cranial Nerves: No cranial nerve deficit.     Motor: No abnormal muscle tone.  Psychiatric:        Mood and Affect: Mood normal.        Behavior: Behavior normal.    ------------------------------------------------------------------------------------------------------------------------------------------------------------------------------------------------------------------- Assessment and Plan  Well adult exam Well adult Orders Placed This Encounter  Procedures   COMPLETE METABOLIC PANEL WITH GFR   CBC with Differential   Lipid Panel w/reflex Direct LDL   TSH   PSA  Screening: PSA and lipid panel. Immunizations: Declines COVID and shingles vaccine.  He is aware of risk and benefits of vaccination against these conditions. Anticipatory guidance/risk factor reduction: Recommendations per AVS   No orders of the defined types were placed in this encounter.   No follow-ups on file.    This visit occurred during the SARS-CoV-2 public health emergency.  Safety protocols were in  place, including screening questions prior to the visit, additional usage of staff PPE, and extensive cleaning of exam room while observing appropriate contact time as indicated for disinfecting solutions.

## 2021-03-12 NOTE — Patient Instructions (Signed)
Preventive Care 75 Years and Older, Male Preventive care refers to lifestyle choices and visits with your health care provider that can promote health and wellness. This includes: A yearly physical exam. This is also called an annual wellness visit. Regular dental and eye exams. Immunizations. Screening for certain conditions. Healthy lifestyle choices, such as: Eating a healthy diet. Getting regular exercise. Not using drugs or products that contain nicotine and tobacco. Limiting alcohol use. What can I expect for my preventive care visit? Physical exam Your health care provider will check your: Height and weight. These may be used to calculate your BMI (body mass index). BMI is a measurement that tells if you are at a healthy weight. Heart rate and blood pressure. Body temperature. Skin for abnormal spots. Counseling Your health care provider may ask you questions about your: Past medical problems. Family's medical history. Alcohol, tobacco, and drug use. Emotional well-being. Home life and relationship well-being. Sexual activity. Diet, exercise, and sleep habits. History of falls. Memory and ability to understand (cognition). Work and work environment. Access to firearms. What immunizations do I need?  Vaccines are usually given at various ages, according to a schedule. Your health care provider will recommend vaccines for you based on your age, medicalhistory, and lifestyle or other factors, such as travel or where you work. What tests do I need? Blood tests Lipid and cholesterol levels. These may be checked every 5 years, or more often depending on your overall health. Hepatitis C test. Hepatitis B test. Screening Lung cancer screening. You may have this screening every year starting at age 55 if you have a 30-pack-year history of smoking and currently smoke or have quit within the past 15 years. Colorectal cancer screening. All adults should have this screening  starting at age 50 and continuing until age 75. Your health care provider may recommend screening at age 45 if you are at increased risk. You will have tests every 1-10 years, depending on your results and the type of screening test. Prostate cancer screening. Recommendations will vary depending on your family history and other risks. Genital exam to check for testicular cancer or hernias. Diabetes screening. This is done by checking your blood sugar (glucose) after you have not eaten for a while (fasting). You may have this done every 1-3 years. Abdominal aortic aneurysm (AAA) screening. You may need this if you are a current or former smoker. STD (sexually transmitted disease) testing, if you are at risk. Follow these instructions at home: Eating and drinking  Eat a diet that includes fresh fruits and vegetables, whole grains, lean protein, and low-fat dairy products. Limit your intake of foods with high amounts of sugar, saturated fats, and salt. Take vitamin and mineral supplements as recommended by your health care provider. Do not drink alcohol if your health care provider tells you not to drink. If you drink alcohol: Limit how much you have to 0-2 drinks a day. Be aware of how much alcohol is in your drink. In the [place] one drink equals one 12 oz bottle of beer (355 mL), one 5 oz glass of wine (148 mL), or one 1 oz glass of hard liquor (44 mL).  Lifestyle Take daily care of your teeth and gums. Brush your teeth every morning and night with fluoride toothpaste. Floss one time each day. Stay active. Exercise for at least 30 minutes 5 or more days each week. Do not use any products that contain nicotine or tobacco, such as cigarettes, e-cigarettes, and chewing tobacco.   If you need help quitting, ask your health care provider. Do not use drugs. If you are sexually active, practice safe sex. Use a condom or other form of protection to prevent STIs (sexually transmitted infections). Talk  with your health care provider about taking a low-dose aspirin or statin. Find healthy ways to cope with stress, such as: Meditation, yoga, or listening to music. Journaling. Talking to a trusted person. Spending time with friends and family. Safety Always wear your seat belt while driving or riding in a vehicle. Do not drive: If you have been drinking alcohol. Do not ride with someone who has been drinking. When you are tired or distracted. While texting. Wear a helmet and other protective equipment during sports activities. If you have firearms in your house, make sure you follow all gun safety procedures. What's next? Visit your health care provider once a year for an annual wellness visit. Ask your health care provider how often you should have your eyes and teeth checked. Stay up to date on all vaccines. This information is not intended to replace advice given to you by your health care provider. Make sure you discuss any questions you have with your healthcare provider. Document Revised: 05/03/2019 Document Reviewed: 07/29/2018 Elsevier Patient Education  2022 Elsevier Inc.  

## 2021-03-12 NOTE — Assessment & Plan Note (Signed)
Well adult Orders Placed This Encounter  Procedures  . COMPLETE METABOLIC PANEL WITH GFR  . CBC with Differential  . Lipid Panel w/reflex Direct LDL  . TSH  . PSA  Screening: PSA and lipid panel. Immunizations: Declines COVID and shingles vaccine.  He is aware of risk and benefits of vaccination against these conditions. Anticipatory guidance/risk factor reduction: Recommendations per AVS

## 2021-03-13 ENCOUNTER — Telehealth: Payer: Self-pay

## 2021-03-13 NOTE — Telephone Encounter (Signed)
-----   Message from Luetta Nutting, DO sent at 03/13/2021  7:37 AM EDT ----- Please let patient know that labs look good.  Very mild elevation in glucose but not worrisome for diabetes. Continue active lifestyle and healthy diet.   Thanks!  CM

## 2021-03-13 NOTE — Telephone Encounter (Signed)
LVM for pt with results.  Advised pt to call back with any questions/concerns.  Charyl Bigger, CMA

## 2021-05-15 DIAGNOSIS — L570 Actinic keratosis: Secondary | ICD-10-CM | POA: Diagnosis not present

## 2021-05-15 DIAGNOSIS — L821 Other seborrheic keratosis: Secondary | ICD-10-CM | POA: Diagnosis not present

## 2021-05-15 DIAGNOSIS — D225 Melanocytic nevi of trunk: Secondary | ICD-10-CM | POA: Diagnosis not present

## 2021-05-15 DIAGNOSIS — L578 Other skin changes due to chronic exposure to nonionizing radiation: Secondary | ICD-10-CM | POA: Diagnosis not present

## 2021-07-08 DIAGNOSIS — H02052 Trichiasis without entropian right lower eyelid: Secondary | ICD-10-CM | POA: Diagnosis not present

## 2021-07-08 DIAGNOSIS — H2513 Age-related nuclear cataract, bilateral: Secondary | ICD-10-CM | POA: Diagnosis not present

## 2021-07-08 DIAGNOSIS — H02831 Dermatochalasis of right upper eyelid: Secondary | ICD-10-CM | POA: Diagnosis not present

## 2021-07-08 DIAGNOSIS — H524 Presbyopia: Secondary | ICD-10-CM | POA: Diagnosis not present

## 2021-07-16 ENCOUNTER — Other Ambulatory Visit: Payer: Self-pay | Admitting: Cardiovascular Disease

## 2021-09-28 ENCOUNTER — Other Ambulatory Visit: Payer: Self-pay | Admitting: Cardiovascular Disease

## 2021-10-26 ENCOUNTER — Other Ambulatory Visit: Payer: Self-pay | Admitting: Cardiovascular Disease

## 2021-10-28 ENCOUNTER — Other Ambulatory Visit: Payer: Self-pay | Admitting: Cardiovascular Disease

## 2021-10-28 NOTE — Telephone Encounter (Signed)
Pt last saw Dr Acie Fredrickson 09/18/20, pt is overdue for follow-up. Message sent to schedulers to contact pt for appt.  Last labs 03/12/21 Creat 0.88, age 76, weight 102.1kg, based on specified criteria pt is on appropriate dosage of Eliquis '5mg'$  BID for afib.  Will await appt to refill rx.  ?

## 2021-10-29 NOTE — Telephone Encounter (Signed)
Pt has scheduled appt to see Dr Acie Fredrickson 12/09/21.Will refill rx to get pt to upcoming appt. ?

## 2021-12-08 ENCOUNTER — Other Ambulatory Visit: Payer: Self-pay | Admitting: Cardiovascular Disease

## 2021-12-09 ENCOUNTER — Ambulatory Visit: Payer: Medicare Other | Admitting: Cardiovascular Disease

## 2022-01-13 ENCOUNTER — Encounter: Payer: Self-pay | Admitting: Cardiovascular Disease

## 2022-01-13 NOTE — Progress Notes (Unsigned)
Adam Parrish Date of Birth  1946-05-23 [place] [address]    [place]   [place], [place]  [address]    [place], [place]  [address] [phone]  Fax  [phone]  [phone]  Fax [phone]  Problem LIst: 1. Atrial Fibrillation 2.  Chronic anticoagulation   Adam Parrish is a 76 y.o.  Man with chronic atrial fib   Dec. 23, 2014:  Adam Parrish is doing well.  Exercising regularly.  No CP or dyspnea.  Stable AF with well-controlled ventricular response. He remains on Coumadin.  He has his Coumadin levels checked through [facility] office. His INR levels are well controlled.  Dec. 17, 2015:  Adam Parrish is doing well.  He is seen for follow up of his atrial fib. Typical aches and pain. No CP and dyspnea.  Dec. 19, 2016:  Adam Parrish is doing ok from a cardiac standpoint. No Cp or dyspnea.  Tore his right ham string - while at work . INR is checked at [facility].   Dec. 13, 2017:  Adam Parrish is seen with wife Adam Parrish.  No CP or dyspnea.    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  On chronic coumadin - managed by Dr. Rex Parrish .   August 04, 2017:  Feeling well.    Has had a cold for 2 weeks.  No CP or dyspnea.  June 30, 2018: Adam Parrish is seen today as a work in visit.  He was at his primary medical doctor's office yesterday and complained of having some episodes of chest pain.  He has a history of chronic atrial fibrillation.  We was at work on Monday  Picked up a large roll of wire ( 125 lbs)  Got dizzy  Did not feel well for the rest of the work day  [employer] , took a nap and felt better  Has a constant chest pressure .  The pressure seemed to be relieved wth lifting his arm up   Dec. 18, 2019:  Adam Parrish is seen today for follow-up of an episode of chest pain that he had a month or so ago.  We performed a stress Myoview study.  He had no evidence of ischemia.  The ejection fraction was 45%.  No  dyspnea.  No further CP .   Dec. 8, 2020  Adam Parrish is seen  today for follow-up of his chronic atrial fibrillation.  He said some chest pains in the past. Has rare chest wall pain .    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  VS look good   Feb. 1, 2022: Adam Parrish is seen today for follow up of his chronic Afib. Is very busy,  Is a handiman now  No CP ,    Adam Parrish , his wife , has heard of generic Apixaban and he would like a prescription for this  ( I've just heard that the generic med has been approved but is not available in pharmacies yet )   Jan 14, 2022; Adam Parrish is seen for follow up of his Atrial fib   Current Outpatient Medications on File Prior to Visit  Medication Sig Dispense Refill   apixaban (ELIQUIS) 5 MG TABS tablet Take 1 tablet (5 mg total) by mouth 2 (two) times daily. OVERDUE for follow-up, MUST see MD for FUTURE refills. 180 tablet 0   glucosamine-chondroitin 500-400 MG tablet Take  1 tablet by mouth 2 (two) times daily.     Methylsulfonylmethane (MSM PO) Take by mouth. 1/3 TSP DAILY     metoprolol tartrate (LOPRESSOR) 50 MG tablet TAKE 1 TABLET BY MOUTH TWICE  DAILY 60 tablet 0   Misc Natural Products (SAW PALMETTO PLUS) CAPS Take 1 capsule by mouth daily at 12 noon.     Multiple Vitamin (CALCIUM COMPLEX PO) Take 1 tablet by mouth daily.     No current facility-administered medications on file prior to visit.    Allergies  Allergen Reactions   Shellfish Allergy Nausea And Vomiting, Swelling and Rash    Past Medical History:  Diagnosis Date   Atrial fibrillation (HCC)    Chronic    Chronic anticoagulation    followed at [facility]   Erectile dysfunction     Past Surgical History:  Procedure Laterality Date   CARDIOVASCULAR STRESS TEST  10-15-2005   EF 54%   CIRCUMCISION REVISION  1986   US ECHOCARDIOGRAPHY  07-24-2004   EF 55-60%   VASECTOMY REVERSAL  1986    Social History   Tobacco Use  Smoking Status Former   Packs/day: 1.00   Years:  20.00   Pack years: 20.00   Types: Cigarettes   Quit date: 08/19/1975   Years since quitting: 46.4  Smokeless Tobacco Never    Social History   Substance and Sexual Activity  Alcohol Use Yes   Alcohol/week: 7.0 standard drinks   Types: 7 Cans of beer per week   Comment: Daily    Family History  Problem Relation Age of Onset   Alzheimer's disease Mother    Prostate cancer Father     Reviw of Systems:  Reviewed in the HPI.  All other systems are negative.  Physical Exam: There were no vitals taken for this visit.  GEN:  Well nourished, well developed in no acute distress HEENT: Normal NECK: No JVD; No carotid bruits LYMPHATICS: No lymphadenopathy CARDIAC: RRR ***, no murmurs, rubs, gallops RESPIRATORY:  Clear to auscultation without rales, wheezing or rhonchi  ABDOMEN: Soft, non-tender, non-distended MUSCULOSKELETAL:  No edema; No deformity  SKIN: Warm and dry NEUROLOGIC:  Alert and oriented x 3      ECG: 09/18/2020:   Assessment / Plan:     1. Atrial Fibrillation:        2.  Chronic anticoagulation - .     [name], MD  01/13/2022 7:40 PM    [address],  [place], [place]  [address] Pager [phone] Phone: [phone]; Fax: [phone]

## 2022-01-14 ENCOUNTER — Ambulatory Visit (INDEPENDENT_AMBULATORY_CARE_PROVIDER_SITE_OTHER): Payer: Medicare Other | Admitting: Cardiovascular Disease

## 2022-01-14 ENCOUNTER — Encounter: Payer: Self-pay | Admitting: Cardiovascular Disease

## 2022-01-14 VITALS — BP 128/80 | HR 70 | Ht 73.0 in | Wt 228.0 lb

## 2022-01-14 DIAGNOSIS — I4821 Permanent atrial fibrillation: Secondary | ICD-10-CM | POA: Diagnosis not present

## 2022-01-14 NOTE — Patient Instructions (Signed)

## 2022-02-01 ENCOUNTER — Other Ambulatory Visit: Payer: Self-pay | Admitting: Cardiovascular Disease

## 2022-02-03 ENCOUNTER — Other Ambulatory Visit: Payer: Self-pay | Admitting: Cardiovascular Disease

## 2022-02-03 DIAGNOSIS — I482 Chronic atrial fibrillation, unspecified: Secondary | ICD-10-CM

## 2022-02-03 NOTE — Telephone Encounter (Signed)
Eliquis '5mg'$  refill request received. Patient is 76 years old, weight-103.4kg, Crea-0.88 on 03/12/2021, Diagnosis-Afib, and last seen by Dr. Acie Fredrickson on 01/14/2022. Dose is appropriate based on dosing criteria. Will send in refill to requested pharmacy.

## 2022-02-12 ENCOUNTER — Encounter: Payer: Self-pay | Admitting: Family Medicine

## 2022-02-12 ENCOUNTER — Ambulatory Visit: Payer: Self-pay

## 2022-02-12 ENCOUNTER — Ambulatory Visit: Payer: Medicare Other | Admitting: Family Medicine

## 2022-02-12 VITALS — BP 132/82 | Ht 73.0 in | Wt 223.0 lb

## 2022-02-12 DIAGNOSIS — S29012A Strain of muscle and tendon of back wall of thorax, initial encounter: Secondary | ICD-10-CM | POA: Diagnosis not present

## 2022-02-12 DIAGNOSIS — M25512 Pain in left shoulder: Secondary | ICD-10-CM

## 2022-02-12 NOTE — Progress Notes (Signed)
  Adam Parrish - 76 y.o. male MRN [id number]  Date of birth: [birth year]  SUBJECTIVE:  Including CC & ROS.  No chief complaint on file.   Adam Parrish is a 76 y.o. male that is presenting with left lateral flank pain.  Felt the pain after he was swinging out of an attic on a 2 x 4.  Feels this in the mid axillary region.  No bruising or weakness.    Review of Systems See HPI   HISTORY: Past Medical, Surgical, Social, and Family History Reviewed & Updated per EMR.   Pertinent Historical Findings include:  Past Medical History:  Diagnosis Date   Atrial fibrillation ([place])    Chronic    Chronic anticoagulation    followed at [facility]   Erectile dysfunction     Past Surgical History:  Procedure Laterality Date   CARDIOVASCULAR STRESS TEST  10-15-2005   EF 54%   CIRCUMCISION REVISION  1986   US ECHOCARDIOGRAPHY  07-24-2004   EF 55-60%   VASECTOMY REVERSAL  1986     PHYSICAL EXAM:  VS: BP 132/82 (BP Location: Right Arm, Patient Position: Sitting, Cuff Size: Normal)   Ht '6\' 1"'$  (1.854 m)   Wt 223 lb (101.2 kg)   BMI 29.42 kg/m  Physical Exam Gen: NAD, alert, cooperative with exam, well-appearing MSK:  Neurovascularly intact    Limited ultrasound: Left flank:  Normal-appearing ribs in the mid axillary space. There is increased hyperemia at the midportion of the latissimus dorsi muscle.   Summary: Findings consistent with weakness Adam [facility]rish dorso strain.  Ultrasound and interpretation by Adam Coots, MD    ASSESSMENT & PLAN:   Strain of latissimus dorsi muscle Acutely occurring.  No structural defect appreciated but does appear to be irritated in the latissimus. -Counseled on home exercise therapy and supportive care. -Counseled on compression. -Could consider physical therapy.

## 2022-02-12 NOTE — Assessment & Plan Note (Signed)
Acutely occurring.  No structural defect appreciated but does appear to be irritated in the latissimus. -Counseled on home exercise therapy and supportive care. -Counseled on compression. -Could consider physical therapy.

## 2022-02-12 NOTE — Patient Instructions (Signed)
Nice to meet you Please try heat  You can consider an ace wrap  You could try voltaren  Please try the exercises  Please send me a message in MyChart with any questions or updates.  Please see me back in 4 weeks or as needed if better.   --Dr. Raeford Razor

## 2022-04-09 ENCOUNTER — Encounter: Payer: Self-pay | Admitting: General Practice

## 2022-05-21 DIAGNOSIS — L814 Other melanin hyperpigmentation: Secondary | ICD-10-CM | POA: Diagnosis not present

## 2022-05-21 DIAGNOSIS — L821 Other seborrheic keratosis: Secondary | ICD-10-CM | POA: Diagnosis not present

## 2022-05-21 DIAGNOSIS — L578 Other skin changes due to chronic exposure to nonionizing radiation: Secondary | ICD-10-CM | POA: Diagnosis not present

## 2022-05-21 DIAGNOSIS — X32XXXS Exposure to sunlight, sequela: Secondary | ICD-10-CM | POA: Diagnosis not present

## 2022-05-21 DIAGNOSIS — L570 Actinic keratosis: Secondary | ICD-10-CM | POA: Diagnosis not present

## 2022-06-24 ENCOUNTER — Other Ambulatory Visit: Payer: Self-pay | Admitting: Cardiovascular Disease

## 2022-06-24 DIAGNOSIS — I482 Chronic atrial fibrillation, unspecified: Secondary | ICD-10-CM

## 2022-06-24 NOTE — Telephone Encounter (Signed)
Prescription refill request for Eliquis received. Indication:afib Last office visit:5/23 Scr:0.8 Age: 76 Weight:101.2 kg  Prescription refilled

## 2022-07-17 DIAGNOSIS — H43812 Vitreous degeneration, left eye: Secondary | ICD-10-CM | POA: Diagnosis not present

## 2022-07-17 DIAGNOSIS — H2513 Age-related nuclear cataract, bilateral: Secondary | ICD-10-CM | POA: Diagnosis not present

## 2022-07-17 DIAGNOSIS — H02831 Dermatochalasis of right upper eyelid: Secondary | ICD-10-CM | POA: Diagnosis not present

## 2022-07-17 DIAGNOSIS — H02052 Trichiasis without entropian right lower eyelid: Secondary | ICD-10-CM | POA: Diagnosis not present

## 2022-08-10 ENCOUNTER — Other Ambulatory Visit: Payer: Self-pay

## 2022-08-10 ENCOUNTER — Encounter (HOSPITAL_BASED_OUTPATIENT_CLINIC_OR_DEPARTMENT_OTHER): Payer: Self-pay | Admitting: Emergency Medicine

## 2022-08-10 DIAGNOSIS — R000 Tachycardia, unspecified: Secondary | ICD-10-CM | POA: Insufficient documentation

## 2022-08-10 DIAGNOSIS — J209 Acute bronchitis, unspecified: Secondary | ICD-10-CM | POA: Diagnosis not present

## 2022-08-10 DIAGNOSIS — D72829 Elevated white blood cell count, unspecified: Secondary | ICD-10-CM | POA: Diagnosis not present

## 2022-08-10 DIAGNOSIS — Z79899 Other long term (current) drug therapy: Secondary | ICD-10-CM | POA: Insufficient documentation

## 2022-08-10 DIAGNOSIS — Z7901 Long term (current) use of anticoagulants: Secondary | ICD-10-CM | POA: Insufficient documentation

## 2022-08-10 DIAGNOSIS — Z20822 Contact with and (suspected) exposure to covid-19: Secondary | ICD-10-CM | POA: Diagnosis not present

## 2022-08-10 DIAGNOSIS — R0602 Shortness of breath: Secondary | ICD-10-CM | POA: Diagnosis not present

## 2022-08-10 MED ORDER — ALBUTEROL SULFATE HFA 108 (90 BASE) MCG/ACT IN AERS: 2.0000 | INHALATION_SPRAY | RESPIRATORY_TRACT | Status: DC | PRN

## 2022-08-10 NOTE — ED Triage Notes (Signed)
Cough SOB with other URI Sx. X 1 day

## 2022-08-11 ENCOUNTER — Emergency Department (HOSPITAL_BASED_OUTPATIENT_CLINIC_OR_DEPARTMENT_OTHER)
Admission: EM | Admit: 2022-08-11 | Discharge: 2022-08-11 | Disposition: A | Discharge status: Home / Self Care | Payer: Medicare Other | Attending: Emergency Medicine | Admitting: Emergency Medicine

## 2022-08-11 ENCOUNTER — Emergency Department (HOSPITAL_BASED_OUTPATIENT_CLINIC_OR_DEPARTMENT_OTHER): Payer: Medicare Other

## 2022-08-11 DIAGNOSIS — J209 Acute bronchitis, unspecified: Secondary | ICD-10-CM

## 2022-08-11 DIAGNOSIS — R0602 Shortness of breath: Secondary | ICD-10-CM | POA: Diagnosis not present

## 2022-08-11 LAB — CBC WITH DIFFERENTIAL/PLATELET
Abs Immature Granulocytes: 0.06 10*3/uL (ref 0.00–0.07)
Basophils Absolute: 0 10*3/uL (ref 0.0–0.1)
Basophils Relative: 0 %
Eosinophils Absolute: 0.1 10*3/uL (ref 0.0–0.5)
Eosinophils Relative: 1 %
HCT: 44.4 % (ref 39.0–52.0)
Hemoglobin: 15 g/dL (ref 13.0–17.0)
Immature Granulocytes: 1 %
Lymphocytes Relative: 12 %
Lymphs Abs: 1.6 10*3/uL (ref 0.7–4.0)
MCH: 31.3 pg (ref 26.0–34.0)
MCHC: 33.8 g/dL (ref 30.0–36.0)
MCV: 92.7 fL (ref 80.0–100.0)
Monocytes Absolute: 0.9 10*3/uL (ref 0.1–1.0)
Monocytes Relative: 7 %
Neutro Abs: 10.2 10*3/uL — ABNORMAL HIGH (ref 1.7–7.7)
Neutrophils Relative %: 79 %
Platelets: 183 10*3/uL (ref 150–400)
RBC: 4.79 MIL/uL (ref 4.22–5.81)
RDW: 13.2 % (ref 11.5–15.5)
WBC: 12.8 10*3/uL — ABNORMAL HIGH (ref 4.0–10.5)
nRBC: 0 % (ref 0.0–0.2)

## 2022-08-11 LAB — BASIC METABOLIC PANEL
Anion gap: 9 (ref 5–15)
BUN: 13 mg/dL (ref 8–23)
CO2: 26 mmol/L (ref 22–32)
Calcium: 9 mg/dL (ref 8.9–10.3)
Chloride: 102 mmol/L (ref 98–111)
Creatinine, Ser: 0.99 mg/dL (ref 0.61–1.24)
GFR, Estimated: >60 mL/min (ref >60)
Glucose, Bld: 138 mg/dL — ABNORMAL HIGH (ref 70–99)
Potassium: 3.9 mmol/L (ref 3.5–5.1)
Sodium: 137 mmol/L (ref 135–145)

## 2022-08-11 LAB — RESP PANEL BY RT-PCR (RSV, FLU A&B, COVID)  RVPGX2
Influenza A by PCR: NEGATIVE
Influenza B by PCR: NEGATIVE
Resp Syncytial Virus by PCR: NEGATIVE
SARS Coronavirus 2 by RT PCR: NEGATIVE

## 2022-08-11 LAB — BRAIN NATRIURETIC PEPTIDE: B Natriuretic Peptide: 180.2 pg/mL — ABNORMAL HIGH (ref 0.0–100.0)

## 2022-08-11 MED ORDER — ALBUTEROL SULFATE HFA 108 (90 BASE) MCG/ACT IN AERS
2.0000 | INHALATION_SPRAY | Freq: Once | RESPIRATORY_TRACT | Status: AC
  Administered 2022-08-11: 2 via RESPIRATORY_TRACT
  Filled 2022-08-11: qty 6.7

## 2022-08-11 MED ORDER — CEFDINIR 300 MG PO CAPS: 300.0000 mg | ORAL_CAPSULE | Freq: Two times a day (BID) | ORAL | 0 refills | Status: DC

## 2022-08-11 MED ORDER — IPRATROPIUM-ALBUTEROL 0.5-2.5 (3) MG/3ML IN SOLN
3.0000 mL | Freq: Once | RESPIRATORY_TRACT | Status: AC
  Administered 2022-08-11: 3 mL via RESPIRATORY_TRACT
  Filled 2022-08-11: qty 3

## 2022-08-11 MED ORDER — DOXYCYCLINE HYCLATE 100 MG PO TABS
100.0000 mg | ORAL_TABLET | Freq: Once | ORAL | Status: AC
  Administered 2022-08-11: 100 mg via ORAL
  Filled 2022-08-11: qty 1

## 2022-08-11 MED ORDER — ALBUTEROL SULFATE (2.5 MG/3ML) 0.083% IN NEBU
2.5000 mg | INHALATION_SOLUTION | Freq: Once | RESPIRATORY_TRACT | Status: AC
  Administered 2022-08-11: 2.5 mg via RESPIRATORY_TRACT

## 2022-08-11 MED ORDER — CEFDINIR 300 MG PO CAPS
300.0000 mg | ORAL_CAPSULE | Freq: Once | ORAL | Status: AC
  Administered 2022-08-11: 300 mg via ORAL
  Filled 2022-08-11: qty 1

## 2022-08-11 MED ORDER — ALBUTEROL SULFATE (2.5 MG/3ML) 0.083% IN NEBU
INHALATION_SOLUTION | RESPIRATORY_TRACT | Status: AC
  Administered 2022-08-11: 2.5 mg
  Filled 2022-08-11: qty 3

## 2022-08-11 MED ORDER — DOXYCYCLINE HYCLATE 100 MG PO CAPS: 100.0000 mg | ORAL_CAPSULE | Freq: Two times a day (BID) | ORAL | 0 refills | Status: DC

## 2022-08-11 NOTE — ED Provider Notes (Signed)
[facility] EMERGENCY DEPARTMENT Provider Note   CSN: [id number] Arrival date & time: 08/10/22  2344     History  Chief Complaint  Patient presents with   URI   Cough   Shortness of Breath    Adam Parrish is a 76 y.o. male.  The history is provided by the patient, the spouse and medical records.  URI Presenting symptoms: cough   Cough Associated symptoms: shortness of breath   Shortness of Breath Associated symptoms: cough   Adam Parrish is a 76 y.o. male who presents to the Emergency Department complaining of sob.  He presents to the emergency department accompanied by his wife for evaluation of scratchy throat and not feeling well since Saturday at 7 PM.  He reports associated poor sleep.  He has experienced shortness of breath throughout the day today with difficulty breathing.  He also reports associated stuffy nose.  Has a cough that is productive of brown mucus.  No fever, chest pain. No AP, N/V.  No leg swelling/pain.    He is in permanent atrial fibrillation and is on chronic anticoagulation.     Home Medications Prior to Admission medications   Medication Sig Start Date End Date Taking? Authorizing Provider  cefdinir (OMNICEF) 300 MG capsule Take 1 capsule (300 mg total) by mouth 2 (two) times daily. 08/11/22  Yes Quintella Reichert, MD  doxycycline (VIBRAMYCIN) 100 MG capsule Take 1 capsule (100 mg total) by mouth 2 (two) times daily. 08/11/22  Yes Quintella Reichert, MD  ELIQUIS 5 MG TABS tablet TAKE 1 TABLET BY MOUTH TWICE  DAILY 06/24/22   Nahser, Wonda Cheng, MD  glucosamine-chondroitin 500-400 MG tablet Take 1 tablet by mouth 2 (two) times daily.    [provider]  Methylsulfonylmethane (MSM PO) Take by mouth. 1/3 TSP DAILY    [provider]  metoprolol tartrate (LOPRESSOR) 50 MG tablet TAKE 1 TABLET BY MOUTH TWICE  DAILY 02/05/22   Nahser, Wonda Cheng, MD  Misc Natural Products (SAW PALMETTO PLUS) CAPS Take 1 capsule by mouth daily at  12 noon.    [provider]  Multiple Vitamin (CALCIUM COMPLEX PO) Take 1 tablet by mouth daily.    [provider]      Allergies    Shellfish allergy    Review of Systems   Review of Systems  Respiratory:  Positive for cough and shortness of breath.   All other systems reviewed and are negative.   Physical Exam Updated Vital Signs BP 128/83   Pulse (!) 102   Temp 99.2 F (37.3 C) (Oral)   Resp 20   Ht 6' 1.5" (1.867 m)   Wt 99.8 kg   SpO2 98%   BMI 28.63 kg/m  Physical Exam Vitals and nursing note reviewed.  Constitutional:      Appearance: He is well-developed.  HENT:     Head: Normocephalic and atraumatic.  Cardiovascular:     Rate and Rhythm: Tachycardia present. Rhythm irregular.     Heart sounds: No murmur heard. Pulmonary:     Effort: Pulmonary effort is normal. No respiratory distress.     Comments: Diffuse wheezes/rhonchi.  Decreased air movement in left lung base Abdominal:     Palpations: Abdomen is soft.     Tenderness: There is no abdominal tenderness. There is no guarding or rebound.  Musculoskeletal:        General: No swelling or tenderness.  Skin:    General: Skin is warm and dry.  Neurological:     Mental Status: He is alert and oriented to person, place, and time.  Psychiatric:        Behavior: Behavior normal.     ED Results / Procedures / Treatments   Labs (all labs ordered are listed, but only abnormal results are displayed) Labs Reviewed  BRAIN NATRIURETIC PEPTIDE - Abnormal; Notable for the following components:      Result Value   B Natriuretic Peptide 180.2 (*)    All other components within normal limits  CBC WITH DIFFERENTIAL/PLATELET - Abnormal; Notable for the following components:   WBC 12.8 (*)    Neutro Abs 10.2 (*)    All other components within normal limits  BASIC METABOLIC PANEL - Abnormal; Notable for the following components:   Glucose, Bld 138 (*)    All other components within normal limits   RESP PANEL BY RT-PCR (RSV, FLU A&B, COVID)  RVPGX2    EKG EKG Interpretation  Date/Time:  Sunday August 10 2022 23:53:21 EST Ventricular Rate:  112 PR Interval:    QRS Duration: 72 QT Interval:  314 QTC Calculation: 428 R Axis:   29 Text Interpretation: Atrial fibrillation with rapid ventricular response Nonspecific T wave abnormality Abnormal ECG Confirmed by Quintella Reichert [phone]) on 08/10/2022 11:57:37 PM  Radiology DG Chest 2 View  Result Date: 08/11/2022 CLINICAL DATA:  Shortness of breath. EXAM: CHEST - 2 VIEW COMPARISON:  Chest radiograph dated 10/11/2020. FINDINGS: No focal consolidation, pleural effusion, pneumothorax. The cardiac silhouette is within normal limits. No acute osseous pathology. IMPRESSION: No active cardiopulmonary disease. Electronically Signed   By: Anner Crete M.D.   On: 08/11/2022 00:30    Procedures Procedures    Medications Ordered in ED Medications  albuterol (VENTOLIN HFA) 108 (90 Base) MCG/ACT inhaler 2 puff (has no administration in time range)  albuterol (PROVENTIL) (2.5 MG/3ML) 0.083% nebulizer solution 2.5 mg (2.5 mg Nebulization Given 08/11/22 0005)  albuterol (PROVENTIL) (2.5 MG/3ML) 0.083% nebulizer solution (2.5 mg  Given 08/11/22 0005)  ipratropium-albuterol (DUONEB) 0.5-2.5 (3) MG/3ML nebulizer solution 3 mL (3 mLs Nebulization Given 08/11/22 0109)  albuterol (VENTOLIN HFA) 108 (90 Base) MCG/ACT inhaler 2 puff (2 puffs Inhalation Given 08/11/22 0430)  cefdinir (OMNICEF) capsule 300 mg (300 mg Oral Given 08/11/22 0428)  doxycycline (VIBRA-TABS) tablet 100 mg (100 mg Oral Given 08/11/22 0428)    ED Course/ Medical Decision Making/ A&P                           Medical Decision Making Amount and/or Complexity of Data Reviewed Labs: ordered. Radiology: ordered.  Risk Prescription drug management.   Patient with history of atrial fibrillation on anticoagulation here for evaluation of shortness of breath and cough.  He is  nontoxic-appearing on evaluation.  He does have wheezes, decreased air movement in the left lung base without respiratory distress.  He was treated with albuterol with significant improvement in his symptoms.  He does not have history of reactive airway disease.  Chest x-ray without acute infiltrate-images personally reviewed and interpreted, agree with radiologist interpretation.  He is negative for COVID, influenza and RSV.  CBC with mild leukocytosis.  There is mild elevation in his BNP without any overt evidence of acute CHF.  Given patient is feeling significantly better after treatment in the emergency department feel he is stable for discharge home.  There is concern for possible early pneumonia and would favor starting antibiotics given his clinical picture.  Discussed very close return precautions if he has any new or concerning symptoms, particularly worsening respiratory symptoms.  He is in rate controlled atrial fibrillation during his ED stay.  He can ambulate the department without difficulty or hypoxia.       Final Clinical Impression(s) / ED Diagnoses Final diagnoses:  Acute bronchitis, unspecified organism    Rx / DC Orders ED Discharge Orders          Ordered    cefdinir (OMNICEF) 300 MG capsule  2 times daily        08/11/22 0416    doxycycline (VIBRAMYCIN) 100 MG capsule  2 times daily        08/11/22 0416              Quintella Reichert, MD 08/11/22 [phone]

## 2022-08-11 NOTE — ED Notes (Signed)
Pt ambulated on pulse Ox on room air. Pt maintained 94%

## 2022-09-11 DIAGNOSIS — H43812 Vitreous degeneration, left eye: Secondary | ICD-10-CM | POA: Diagnosis not present

## 2022-12-01 ENCOUNTER — Encounter: Payer: Self-pay | Admitting: *Deleted

## 2022-12-30 ENCOUNTER — Other Ambulatory Visit: Payer: Self-pay | Admitting: Cardiovascular Disease

## 2023-01-18 NOTE — Progress Notes (Unsigned)
  Cardiology Office Note:    Date:  01/19/2023  ID:  Adam Parrish, DOB 09-22-1945, MRN [id number] PCP: Everrett Coombe, DO  [facility] Cardiologist:  Kristeen Miss, MD       Patient Profile:      Permanent atrial fibrillation Myoview 07/13/2018: EF 45, apical thinning, no ischemia, low risk TTE 08/30/2018: EF 60-65, no LVH, no RWMA, mild MR, mild-moderate LAE, mild RAE Hypertension       History of Present Illness:   Adam Parrish is a 77 y.o. male who returns for follow-up of atrial fibrillation.  He was last seen by Dr. Elease Hashimoto 01/14/2022.  He is here alone.  He is doing well without chest pain, shortness of breath, syncope, orthopnea, leg edema.  He has rare palpitations.  Review of Systems  Gastrointestinal:  Negative for hematochezia and melena.  Genitourinary:  Negative for hematuria.    See the HPI    Studies Reviewed:    EKG: Not done EKG from 08/06/2022 personally reviewed-atrial fibrillation, HR 112, nonspecific ST-T wave changes  Risk Assessment/Calculations:    CHA2DS2-VASc Score = 3   This indicates a 3.2% annual risk of stroke. The patient's score is based upon: CHF History: 0 HTN History: 1 Diabetes History: 0 Stroke History: 0 Vascular Disease History: 0 Age Score: 2 Gender Score: 0            Physical Exam:   VS:  BP 118/80   Pulse 68   Ht 6' 1.5" (1.867 m)   Wt 222 lb 6.4 oz (100.9 kg)   SpO2 96%   BMI 28.94 kg/m    Wt Readings from Last 3 Encounters:  01/19/23 222 lb 6.4 oz (100.9 kg)  08/10/22 220 lb (99.8 kg)  02/12/22 223 lb (101.2 kg)    Constitutional:      Appearance: Healthy appearance. Not in distress.  Neck:     Vascular: JVD normal.  Pulmonary:     Breath sounds: Normal breath sounds. No wheezing. No rales.  Cardiovascular:     Normal rate. Irregularly irregular rhythm.     Murmurs: There is no murmur.  Edema:    Peripheral edema absent.  Abdominal:     Palpations: Abdomen is soft.       ASSESSMENT  AND PLAN:   Atrial fibrillation (HCC) Rate controlled. He is asymptomatic. He has been managed with a strategy of rate control. He is tolerating beta-blocker, anticoagulation therapy. Labs from Dec 2023 were reviewed. His Hgb and Creatinine were both normal. Age < 80, SCr < 1.5, Wt > 60 kg. Continue Eliquis 5 mg twice daily, Metoprolol tartrate 50 mg twice daily. F/u 1 year.      Dispo:  Return in about 1 year (around 01/19/2024) for Routine Follow Up, w/ Dr. Elease Hashimoto.  Signed, Tereso Newcomer, PA-C

## 2023-01-19 ENCOUNTER — Encounter: Payer: Self-pay | Admitting: Physician Assistant

## 2023-01-19 ENCOUNTER — Ambulatory Visit: Payer: Medicare Other | Attending: Physician Assistant | Admitting: Physician Assistant

## 2023-01-19 VITALS — BP 118/80 | HR 68 | Ht 73.5 in | Wt 222.4 lb

## 2023-01-19 DIAGNOSIS — I4821 Permanent atrial fibrillation: Secondary | ICD-10-CM

## 2023-01-19 NOTE — Assessment & Plan Note (Signed)
Rate controlled. He is asymptomatic. He has been managed with a strategy of rate control. He is tolerating beta-blocker, anticoagulation therapy. Labs from Dec 2023 were reviewed. His Hgb and Creatinine were both normal. Age < 80, SCr < 1.5, Wt > 60 kg. Continue Eliquis 5 mg twice daily, Metoprolol tartrate 50 mg twice daily. F/u 1 year.

## 2023-01-19 NOTE — Patient Instructions (Signed)
Medication Instructions:   Your physician recommends that you continue on your current medications as directed. Please refer to the Current Medication list given to you today.  *If you need a refill on your cardiac medications before your next appointment, please call your pharmacy*   Lab Work:  None Ordered today   If you have labs (blood work) drawn today and your tests are completely normal, you will receive your results only by: MyChart Message (if you have MyChart) OR A paper copy in the mail If you have any lab test that is abnormal or we need to change your treatment, we will call you to review the results.   Testing/Procedures:   None Ordered today     Follow-Up: At [facility], you and your health needs are our priority.  As part of our continuing mission to provide you with exceptional heart care, we have created designated Provider Care Teams.  These Care Teams include your primary Cardiologist (physician) and Advanced Practice Providers (APPs -  Physician Assistants and Nurse Practitioners) who all work together to provide you with the care you need, when you need it.  We recommend signing up for the patient portal called "MyChart".  Sign up information is provided on this After Visit Summary.  MyChart is used to connect with patients for Virtual Visits (Telemedicine).  Patients are able to view lab/test results, encounter notes, upcoming appointments, etc.  Non-urgent messages can be sent to your provider as well.   To learn more about what you can do with MyChart, go to [email or web address].    Your next appointment:   1 year(s)  Provider:   Kristeen Miss, MD     Other Instructions

## 2023-01-27 ENCOUNTER — Ambulatory Visit (INDEPENDENT_AMBULATORY_CARE_PROVIDER_SITE_OTHER): Payer: Medicare Other | Admitting: Family Medicine

## 2023-01-27 ENCOUNTER — Encounter: Payer: Self-pay | Admitting: Family Medicine

## 2023-01-27 VITALS — BP 122/80 | HR 65 | Ht 73.5 in | Wt 225.0 lb

## 2023-01-27 DIAGNOSIS — Z0000 Encounter for general adult medical examination without abnormal findings: Secondary | ICD-10-CM

## 2023-01-27 DIAGNOSIS — I10 Essential (primary) hypertension: Secondary | ICD-10-CM | POA: Diagnosis not present

## 2023-01-27 DIAGNOSIS — H6123 Impacted cerumen, bilateral: Secondary | ICD-10-CM | POA: Diagnosis not present

## 2023-01-27 DIAGNOSIS — Z13220 Encounter for screening for lipoid disorders: Secondary | ICD-10-CM

## 2023-01-27 DIAGNOSIS — H6120 Impacted cerumen, unspecified ear: Secondary | ICD-10-CM | POA: Insufficient documentation

## 2023-01-27 DIAGNOSIS — I4821 Permanent atrial fibrillation: Secondary | ICD-10-CM | POA: Diagnosis not present

## 2023-01-27 DIAGNOSIS — Z125 Encounter for screening for malignant neoplasm of prostate: Secondary | ICD-10-CM | POA: Diagnosis not present

## 2023-01-27 NOTE — Assessment & Plan Note (Signed)
B/L cerumen impaction. Lavage completed today

## 2023-01-27 NOTE — Progress Notes (Signed)
Adam Parrish - 77 y.o. male MRN [id number]  Date of birth: April 26, 1946  Subjective No chief complaint on file.   HPI Adam Parrish is a 77 y.o. male here today for annual exam.   He reports that he is doing well at this time.  Denies new concerns at this time.    He continues to stay active.  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  He feels that his diet is pretty good.    He is a non-smoker.  Rare EtOH use.   He defers shingrix at this time.   Review of Systems  Constitutional:  Negative for chills, fever, malaise/fatigue and weight loss.  HENT:  Negative for congestion, ear pain and sore throat.   Eyes:  Negative for blurred vision, double vision and pain.  Respiratory:  Negative for cough and shortness of breath.   Cardiovascular:  Negative for chest pain and palpitations.  Gastrointestinal:  Negative for abdominal pain, blood in stool, constipation, heartburn and nausea.  Genitourinary:  Negative for dysuria and urgency.  Musculoskeletal:  Negative for joint pain and myalgias.  Neurological:  Negative for dizziness and headaches.  Endo/Heme/Allergies:  Does not bruise/bleed easily.  Psychiatric/Behavioral:  Negative for depression. The patient is not nervous/anxious and does not have insomnia.     Allergies  Allergen Reactions   Shellfish Allergy Nausea And Vomiting, Swelling and Rash    Past Medical History:  Diagnosis Date   Atrial fibrillation (HCC)    Chronic    Chronic anticoagulation    followed at [facility]   Erectile dysfunction     Past Surgical History:  Procedure Laterality Date   CARDIOVASCULAR STRESS TEST  10-15-2005   EF 54%   CIRCUMCISION REVISION  1986   US ECHOCARDIOGRAPHY  07-24-2004   EF 55-60%   VASECTOMY REVERSAL  1986    Social History   Socioeconomic History   Marital status: Married    Spouse name: Not on file   Number of children: Not on file   Years of education: Not on file   Highest education level: Not on  file  Occupational History   Occupation: Self-Employed  Tobacco Use   Smoking status: Former    Packs/day: 1.00    Years: 20.00    Additional pack years: 0.00    Total pack years: 20.00    Types: Cigarettes    Quit date: 08/19/1975    Years since quitting: 47.4   Smokeless tobacco: Never  Vaping Use   Vaping Use: Never used  Substance and Sexual Activity   Alcohol use: Yes    Alcohol/week: 7.0 standard drinks of alcohol    Types: 7 Cans of beer per week    Comment: Daily   Drug use: Never   Sexual activity: Yes    Partners: Female  Other Topics Concern   Not on file  Social History Narrative   Not on file   Social Determinants of Health   Financial Resource Strain: Not on file  Food Insecurity: Not on file  Transportation Needs: Not on file  Physical Activity: Not on file  Stress: Not on file  Social Connections: Not on file    Family History  Problem Relation Age of Onset   Alzheimer's disease Mother    Prostate cancer Father     Health Maintenance  Topic Date Due   Medicare Annual Wellness (AWV)  02/26/2023 (Originally 06-12-1946)   COVID-19 Vaccine (2 - Pfizer risk series)  04/29/2023 (Originally 05/09/2020)   Zoster Vaccines- Shingrix (1 of 2) 04/29/2023 (Originally 01/07/1965)   INFLUENZA VACCINE  03/19/2023   DTaP/Tdap/Td (3 - Td or Tdap) 07/16/2026   Pneumonia Vaccine 84+ Years old  Completed   Hepatitis C Screening  Completed   HPV VACCINES  Aged Out     ----------------------------------------------------------------------------------------------------------------------------------------------------------------------------------------------------------------- Physical Exam BP 122/80 (BP Location: Left Arm, Patient Position: Sitting, Cuff Size: Large)   Pulse 65   Ht 6' 1.5" (1.867 m)   Wt 225 lb (102.1 kg)   SpO2 98%   BMI 29.28 kg/m   Physical Exam Constitutional:      General: He is not in acute distress. HENT:     Head: Normocephalic and  atraumatic.     Right Ear: There is impacted cerumen.     Left Ear: There is impacted cerumen.  Eyes:     General: No scleral icterus. Neck:     Thyroid: No thyromegaly.  Cardiovascular:     Rate and Rhythm: Normal rate and regular rhythm.     Heart sounds: Normal heart sounds.  Pulmonary:     Effort: Pulmonary effort is normal.     Breath sounds: Normal breath sounds.  Abdominal:     General: Bowel sounds are normal. There is no distension.     Palpations: Abdomen is soft.     Tenderness: There is no abdominal tenderness. There is no guarding.  Musculoskeletal:     Cervical back: Normal range of motion.  Lymphadenopathy:     Cervical: No cervical adenopathy.  Skin:    General: Skin is warm and dry.     Findings: No rash.  Neurological:     Mental Status: He is alert and oriented to person, place, and time.     Cranial Nerves: No cranial nerve deficit.     Motor: No abnormal muscle tone.  Psychiatric:        Mood and Affect: Mood normal.        Behavior: Behavior normal.     ------------------------------------------------------------------------------------------------------------------------------------------------------------------------------------------------------------------- Assessment and Plan  Well adult exam Well adult Orders Placed This Encounter  Procedures   COMPLETE METABOLIC PANEL WITH GFR   CBC with Differential   Lipid Panel w/reflex Direct LDL   TSH   PSA  Immunizations:  Deferred.  Screenings: per lab orders Anticipatory guidance/Risk factor reduction:  Recommendations per AVS.    Cerumen impaction B/L cerumen impaction. Lavage completed today  No orders of the defined types were placed in this encounter.   No follow-ups on file.    This visit occurred during the SARS-CoV-2 public health emergency.  Safety protocols were in place, including screening questions prior to the visit, additional usage of staff PPE, and extensive cleaning of  exam room while observing appropriate contact time as indicated for disinfecting solutions.

## 2023-01-27 NOTE — Patient Instructions (Signed)
Preventive Care 65 Years and Older, Male Preventive care refers to lifestyle choices and visits with your health care provider that can promote health and wellness. Preventive care visits are also called wellness exams. What can I expect for my preventive care visit? Counseling During your preventive care visit, your health care provider may ask about your: Medical history, including: Past medical problems. Family medical history. History of falls. Current health, including: Emotional well-being. Home life and relationship well-being. Sexual activity. Memory and ability to understand (cognition). Lifestyle, including: Alcohol, nicotine or tobacco, and drug use. Access to firearms. Diet, exercise, and sleep habits. Work and work environment. Sunscreen use. Safety issues such as seatbelt and bike helmet use. Physical exam Your health care provider will check your: Height and weight. These may be used to calculate your BMI (body mass index). BMI is a measurement that tells if you are at a healthy weight. Waist circumference. This measures the distance around your waistline. This measurement also tells if you are at a healthy weight and may help predict your risk of certain diseases, such as type 2 diabetes and high blood pressure. Heart rate and blood pressure. Body temperature. Skin for abnormal spots. What immunizations do I need?  Vaccines are usually given at various ages, according to a schedule. Your health care provider will recommend vaccines for you based on your age, medical history, and lifestyle or other factors, such as travel or where you work. What tests do I need? Screening Your health care provider may recommend screening tests for certain conditions. This may include: Lipid and cholesterol levels. Diabetes screening. This is done by checking your blood sugar (glucose) after you have not eaten for a while (fasting). Hepatitis C test. Hepatitis B test. HIV (human  immunodeficiency virus) test. STI (sexually transmitted infection) testing, if you are at risk. Lung cancer screening. Colorectal cancer screening. Prostate cancer screening. Abdominal aortic aneurysm (AAA) screening. You may need this if you are a current or former smoker. Talk with your health care provider about your test results, treatment options, and if necessary, the need for more tests. Follow these instructions at home: Eating and drinking  Eat a diet that includes fresh fruits and vegetables, whole grains, lean protein, and low-fat dairy products. Limit your intake of foods with high amounts of sugar, saturated fats, and salt. Take vitamin and mineral supplements as recommended by your health care provider. Do not drink alcohol if your health care provider tells you not to drink. If you drink alcohol: Limit how much you have to 0-2 drinks a day. Know how much alcohol is in your drink. In the [place] one drink equals one 12 oz bottle of beer (355 mL), one 5 oz glass of wine (148 mL), or one 1 oz glass of hard liquor (44 mL). Lifestyle Brush your teeth every morning and night with fluoride toothpaste. Floss one time each day. Exercise for at least 30 minutes 5 or more days each week. Do not use any products that contain nicotine or tobacco. These products include cigarettes, chewing tobacco, and vaping devices, such as e-cigarettes. If you need help quitting, ask your health care provider. Do not use drugs. If you are sexually active, practice safe sex. Use a condom or other form of protection to prevent STIs. Take aspirin only as told by your health care provider. Make sure that you understand how much to take and what form to take. Work with your health care provider to find out whether it is safe   and beneficial for you to take aspirin daily. Ask your health care provider if you need to take a cholesterol-lowering medicine (statin). Find healthy ways to manage stress, such  as: Meditation, yoga, or listening to music. Journaling. Talking to a trusted person. Spending time with friends and family. Safety Always wear your seat belt while driving or riding in a vehicle. Do not drive: If you have been drinking alcohol. Do not ride with someone who has been drinking. When you are tired or distracted. While texting. If you have been using any mind-altering substances or drugs. Wear a helmet and other protective equipment during sports activities. If you have firearms in your house, make sure you follow all gun safety procedures. Minimize exposure to UV radiation to reduce your risk of skin cancer. What's next? Visit your health care provider once a year for an annual wellness visit. Ask your health care provider how often you should have your eyes and teeth checked. Stay up to date on all vaccines. This information is not intended to replace advice given to you by your health care provider. Make sure you discuss any questions you have with your health care provider. Document Revised: 01/30/2021 Document Reviewed: 01/30/2021 Elsevier Patient Education  2024 [employer]  

## 2023-01-27 NOTE — Assessment & Plan Note (Signed)
Well adult Orders Placed This Encounter  Procedures   COMPLETE METABOLIC PANEL WITH GFR   CBC with Differential   Lipid Panel w/reflex Direct LDL   TSH   PSA  Immunizations:  Deferred.  Screenings: per lab orders Anticipatory guidance/Risk factor reduction:  Recommendations per AVS.

## 2023-01-28 LAB — CBC WITH DIFFERENTIAL/PLATELET
Absolute Monocytes: 559 cells/uL (ref 200–950)
Basophils Absolute: 17 cells/uL (ref 0–200)
Basophils Relative: 0.3 %
Eosinophils Absolute: 131 cells/uL (ref 15–500)
Eosinophils Relative: 2.3 %
HCT: 43.6 % (ref 38.5–50.0)
Hemoglobin: 14.5 g/dL (ref 13.2–17.1)
Lymphs Abs: 1995 cells/uL (ref 850–3900)
MCH: 31 pg (ref 27.0–33.0)
MCHC: 33.3 g/dL (ref 32.0–36.0)
MCV: 93.4 fL (ref 80.0–100.0)
MPV: 10.6 fL (ref 7.5–12.5)
Monocytes Relative: 9.8 %
Neutro Abs: 2998 cells/uL (ref 1500–7800)
Neutrophils Relative %: 52.6 %
Platelets: 194 10*3/uL (ref 140–400)
RBC: 4.67 10*6/uL (ref 4.20–5.80)
RDW: 12.9 % (ref 11.0–15.0)
Total Lymphocyte: 35 %
WBC: 5.7 10*3/uL (ref 3.8–10.8)

## 2023-01-28 LAB — COMPLETE METABOLIC PANEL WITH GFR
AG Ratio: 2 (calc) (ref 1.0–2.5)
ALT: 30 U/L (ref 9–46)
AST: 22 U/L (ref 10–35)
Albumin: 4.3 g/dL (ref 3.6–5.1)
Alkaline phosphatase (APISO): 56 U/L (ref 35–144)
BUN: 16 mg/dL (ref 7–25)
CO2: 28 mmol/L (ref 20–32)
Calcium: 9.5 mg/dL (ref 8.6–10.3)
Chloride: 104 mmol/L (ref 98–110)
Creat: 0.92 mg/dL (ref 0.70–1.28)
Globulin: 2.2 g/dL (calc) (ref 1.9–3.7)
Glucose, Bld: 97 mg/dL (ref 65–99)
Potassium: 4.8 mmol/L (ref 3.5–5.3)
Sodium: 139 mmol/L (ref 135–146)
Total Bilirubin: 1 mg/dL (ref 0.2–1.2)
Total Protein: 6.5 g/dL (ref 6.1–8.1)
eGFR: 86 mL/min/{1.73_m2} (ref >=60)

## 2023-01-28 LAB — LIPID PANEL W/REFLEX DIRECT LDL
Cholesterol: 169 mg/dL (ref <200)
HDL: 59 mg/dL (ref >=40)
LDL Cholesterol (Calc): 93 mg/dL (calc)
Non-HDL Cholesterol (Calc): 110 mg/dL (calc) (ref <130)
Total CHOL/HDL Ratio: 2.9 (calc) (ref <5.0)
Triglycerides: 81 mg/dL (ref <150)

## 2023-01-28 LAB — PSA: PSA: 0.31 ng/mL (ref <=4.00)

## 2023-01-28 LAB — TSH: TSH: 3.85 mIU/L (ref 0.40–4.50)

## 2023-02-04 ENCOUNTER — Ambulatory Visit (INDEPENDENT_AMBULATORY_CARE_PROVIDER_SITE_OTHER): Payer: Medicare Other | Admitting: Family Medicine

## 2023-02-04 DIAGNOSIS — Z0000 Encounter for general adult medical examination without abnormal findings: Secondary | ICD-10-CM

## 2023-02-04 NOTE — Patient Instructions (Signed)
MEDICARE ANNUAL WELLNESS VISIT Health Maintenance Summary and Written Plan of Care  Adam Parrish ,  Thank you for allowing me to perform your Medicare Annual Wellness Visit and for your ongoing commitment to your health.   Health Maintenance & Immunization History Health Maintenance  Topic Date Due   COVID-19 Vaccine (2 - Pfizer risk series) 04/29/2023 (Originally 05/09/2020)   Zoster Vaccines- Shingrix (1 of 2) 04/29/2023 (Originally 01/07/1965)   INFLUENZA VACCINE  03/19/2023   Medicare Annual Wellness (AWV)  02/04/2024   DTaP/Tdap/Td (3 - Td or Tdap) 07/16/2026   Pneumonia Vaccine 92+ Years old  Completed   Hepatitis C Screening  Completed   HPV VACCINES  Aged Out   Immunization History  Administered Date(s) Administered   PFIZER(Purple Top)SARS-COV-2 Vaccination 04/18/2020   Pneumococcal Conjugate-13 06/21/2015   Pneumococcal Polysaccharide-23 07/16/2016   Td 07/16/2016   Tdap 07/31/2006   Zoster, Live 09/15/2007    These are the patient goals that we discussed:  Goals Addressed               This Visit's Progress     Patient Stated (pt-stated)        Patient stated that he would like to loose 10 lbs.         This is a list of Health Maintenance Items that are overdue or due now: Shingles vaccine- need records  Orders/Referrals Placed Today: No orders of the defined types were placed in this encounter.  (Contact our referral department at [phone] if you have not spoken with someone about your referral appointment within the next 5 days)    Follow-up Plan Follow-up with Everrett Coombe, DO as planned Needs records for shingles vaccine. Medicare wellness visit in one year.  Patient will access AVS on my chart.      Health Maintenance, Male Adopting a healthy lifestyle and getting preventive care are important in promoting health and wellness. Ask your health care provider about: The right schedule for you to have regular tests and exams. Things you  can do on your own to prevent diseases and keep yourself healthy. What should I know about diet, weight, and exercise? Eat a healthy diet  Eat a diet that includes plenty of vegetables, fruits, low-fat dairy products, and lean protein. Do not eat a lot of foods that are high in solid fats, added sugars, or sodium. Maintain a healthy weight Body mass index (BMI) is a measurement that can be used to identify possible weight problems. It estimates body fat based on height and weight. Your health care provider can help determine your BMI and help you achieve or maintain a healthy weight. Get regular exercise Get regular exercise. This is one of the most important things you can do for your health. Most adults should: Exercise for at least 150 minutes each week. The exercise should increase your heart rate and make you sweat (moderate-intensity exercise). Do strengthening exercises at least twice a week. This is in addition to the moderate-intensity exercise. Spend less time sitting. Even light physical activity can be beneficial. Watch cholesterol and blood lipids Have your blood tested for lipids and cholesterol at 77 years of age, then have this test every 5 years. You may need to have your cholesterol levels checked more often if: Your lipid or cholesterol levels are high. You are older than 77 years of age. You are at high risk for heart disease. What should I know about cancer screening? Many types of cancers can be detected early  and may often be prevented. Depending on your health history and family history, you may need to have cancer screening at various ages. This may include screening for: Colorectal cancer. Prostate cancer. Skin cancer. Lung cancer. What should I know about heart disease, diabetes, and high blood pressure? Blood pressure and heart disease High blood pressure causes heart disease and increases the risk of stroke. This is more likely to develop in people who have  high blood pressure readings or are overweight. Talk with your health care provider about your target blood pressure readings. Have your blood pressure checked: Every 3-5 years if you are 29-71 years of age. Every year if you are 82 years old or older. If you are between the ages of 17 and 3 and are a current or former smoker, ask your health care provider if you should have a one-time screening for abdominal aortic aneurysm (AAA). Diabetes Have regular diabetes screenings. This checks your fasting blood sugar level. Have the screening done: Once every three years after age 20 if you are at a normal weight and have a low risk for diabetes. More often and at a younger age if you are overweight or have a high risk for diabetes. What should I know about preventing infection? Hepatitis B If you have a higher risk for hepatitis B, you should be screened for this virus. Talk with your health care provider to find out if you are at risk for hepatitis B infection. Hepatitis C Blood testing is recommended for: Everyone born from [birth year] through 1965. Anyone with known risk factors for hepatitis C. Sexually transmitted infections (STIs) You should be screened each year for STIs, including gonorrhea and chlamydia, if: You are sexually active and are younger than 77 years of age. You are older than 77 years of age and your health care provider tells you that you are at risk for this type of infection. Your sexual activity has changed since you were last screened, and you are at increased risk for chlamydia or gonorrhea. Ask your health care provider if you are at risk. Ask your health care provider about whether you are at high risk for HIV. Your health care provider may recommend a prescription medicine to help prevent HIV infection. If you choose to take medicine to prevent HIV, you should first get tested for HIV. You should then be tested every 3 months for as long as you are taking the medicine. Follow  these instructions at home: Alcohol use Do not drink alcohol if your health care provider tells you not to drink. If you drink alcohol: Limit how much you have to 0-2 drinks a day. Know how much alcohol is in your drink. In the [place] one drink equals one 12 oz bottle of beer (355 mL), one 5 oz glass of wine (148 mL), or one 1 oz glass of hard liquor (44 mL). Lifestyle Do not use any products that contain nicotine or tobacco. These products include cigarettes, chewing tobacco, and vaping devices, such as e-cigarettes. If you need help quitting, ask your health care provider. Do not use street drugs. Do not share needles. Ask your health care provider for help if you need support or information about quitting drugs. General instructions Schedule regular health, dental, and eye exams. Stay current with your vaccines. Tell your health care provider if: You often feel depressed. You have ever been abused or do not feel safe at home. Summary Adopting a healthy lifestyle and getting preventive care are important in  promoting health and wellness. Follow your health care provider's instructions about healthy diet, exercising, and getting tested or screened for diseases. Follow your health care provider's instructions on monitoring your cholesterol and blood pressure. This information is not intended to replace advice given to you by your health care provider. Make sure you discuss any questions you have with your health care provider. Document Revised: 12/24/2020 Document Reviewed: 12/24/2020 Elsevier Patient Education  2024 [employer].

## 2023-02-04 NOTE — Progress Notes (Signed)
MEDICARE ANNUAL WELLNESS VISIT  02/04/2023  Telephone Visit Disclaimer This Medicare AWV was conducted by telephone due to national recommendations for restrictions regarding the COVID-19 Pandemic (e.g. social distancing).  I verified, using two identifiers, that I am speaking with Adam Parrish or their authorized healthcare agent. I discussed the limitations, risks, security, and privacy concerns of performing an evaluation and management service by telephone and the potential availability of an in-person appointment in the future. The patient expressed understanding and agreed to proceed.  Location of Patient: Home Location of Provider (nurse):  In the office.  Subjective:    Adam Parrish is a 77 y.o. male patient of Everrett Coombe, DO who had a Medicare Annual Wellness Visit today via telephone. Adam Parrish is Working full time and lives with their spouse. he has 7 children. he reports that he is socially active and does interact with friends/family regularly. he is moderately physically active and enjoys hunting, fishing and golfing.  Patient Care Team: Everrett Coombe, DO as PCP - General (Family Medicine) Nahser, Deloris Ping, MD as PCP - Cardiology (Cardiology)     02/04/2023   11:10 AM 12/20/2020   10:23 AM  Advanced Directives  Does Patient Have a Medical Advance Directive? Yes Yes  Type of Advance Directive Living will Healthcare Power of Attorney  Does patient want to make changes to medical advance directive? No - Patient declined No - Patient declined  Copy of Healthcare Power of Attorney in Chart?  Yes - validated most recent copy scanned in chart (See row information)    Hospital Utilization Over the Past 12 Months: # of hospitalizations or ER visits: 1 # of surgeries: 0  Review of Systems    Patient reports that his overall health is unchanged compared to last year.  History obtained from chart review and the patient  Patient Reported Readings (BP, Pulse, CBG,  Weight, etc) none  Pain Assessment Pain : No/denies pain     Current Medications & Allergies (verified) Allergies as of 02/04/2023       Reactions   Shellfish Allergy Nausea And Vomiting, Swelling, Rash        Medication List        Accurate as of February 04, 2023 11:20 AM. If you have any questions, ask your nurse or doctor.          CALCIUM COMPLEX PO Take 1 tablet by mouth daily.   cyanocobalamin 1000 MCG tablet Commonly known as: VITAMIN B12 Take 1,000 mcg by mouth daily.   Eliquis 5 MG Tabs tablet Generic drug: apixaban TAKE 1 TABLET BY MOUTH TWICE  DAILY   metoprolol tartrate 50 MG tablet Commonly known as: LOPRESSOR Take 1 tablet (50 mg total) by mouth 2 (two) times daily. Keep upcoming appt for future refills.   Saw Palmetto Plus Caps Take 1 capsule by mouth daily at 12 noon.        History (reviewed): Past Medical History:  Diagnosis Date   Atrial fibrillation (HCC)    Chronic    Chronic anticoagulation    followed at [facility]   Erectile dysfunction    Past Surgical History:  Procedure Laterality Date   CARDIOVASCULAR STRESS TEST  10-15-2005   EF 54%   CIRCUMCISION REVISION  1986   US ECHOCARDIOGRAPHY  07-24-2004   EF 55-60%   VASECTOMY REVERSAL  1986   Family History  Problem Relation Age of Onset   Alzheimer's disease Mother    Prostate cancer Father  Social History   Socioeconomic History   Marital status: Married    Spouse name: Bennard Zafar   Number of children: 7   Years of education: 14   Highest education level: Associate degree: occupational, Scientist, product/process development, or vocational program  Occupational History   Occupation: Self-Employed   Occupation: working full time  Tobacco Use   Smoking status: Former    Packs/day: 1.00    Years: 20.00    Additional pack years: 0.00    Total pack years: 20.00    Types: Cigarettes    Quit date: 08/19/1975    Years since quitting: 47.4   Smokeless tobacco: Never  Vaping Use   Vaping  Use: Never used  Substance and Sexual Activity   Alcohol use: Yes    Alcohol/week: 7.0 standard drinks of alcohol    Types: 7 Cans of beer per week    Comment: Daily   Drug use: Never   Sexual activity: Yes    Partners: Female  Other Topics Concern   Not on file  Social History Narrative   Lives with spouse. He has seven children. He enjoys hunting, fishing and golfing.   Social Determinants of Health   Financial Resource Strain: Low Risk  (02/04/2023)   Overall Financial Resource Strain (CARDIA)    Difficulty of Paying Living Expenses: Not hard at all  Food Insecurity: No Food Insecurity (02/04/2023)   Hunger Vital Sign    Worried About Running Out of Food in the Last Year: Never true    Ran Out of Food in the Last Year: Never true  Transportation Needs: No Transportation Needs (02/04/2023)   PRAPARE - Administrator, Civil Service (Medical): No    Lack of Transportation (Non-Medical): No  Physical Activity: Sufficiently Active (02/04/2023)   Exercise Vital Sign    Days of Exercise per Week: 4 days    Minutes of Exercise per Session: 50 min  Stress: No Stress Concern Present (02/04/2023)   [employer] of Occupational Health - Occupational Stress Questionnaire    Feeling of Stress : Not at all  Social Connections: Socially Integrated (02/04/2023)   Social Connection and Isolation Panel [NHANES]    Frequency of Communication with Friends and Family: More than three times a week    Frequency of Social Gatherings with Friends and Family: Three times a week    Attends Religious Services: More than 4 times per year    Active Member of Clubs or Organizations: Yes    Attends Banker Meetings: More than 4 times per year    Marital Status: Married    Activities of Daily Living    02/04/2023   11:09 AM  In your present state of health, do you have any difficulty performing the following activities:  Hearing? 0  Vision? 0  Difficulty concentrating or  making decisions? 0  Walking or climbing stairs? 0  Dressing or bathing? 0  Doing errands, shopping? 0  Preparing Food and eating ? N  Using the Toilet? N  In the past six months, have you accidently leaked urine? N  Do you have problems with loss of bowel control? N  Managing your Medications? N  Managing your Finances? N  Housekeeping or managing your Housekeeping? N    Patient Education/ Literacy How often do you need to have someone help you when you read instructions, pamphlets, or other written materials from your doctor or pharmacy?: 1 - Never What is the last grade level you completed in  school?: Associates degree  Exercise    Diet Patient reports consuming 3 meals a day and 0-1 snack(s) a day Patient reports that his primary diet is: Regular Patient reports that she does have regular access to food.   Depression Screen    02/04/2023   11:10 AM 01/27/2023    9:46 AM 03/12/2021    8:41 AM 12/20/2020   10:35 AM  PHQ 2/9 Scores  PHQ - 2 Score 0 0 0 1  PHQ- 9 Score    5     Fall Risk    02/04/2023   11:10 AM 01/27/2023    9:46 AM 03/12/2021    8:41 AM 12/20/2020   10:35 AM  Fall Risk   Falls in the past year? 0 0 1 0  Number falls in past yr: 0 0 0 0  Injury with Fall? 0 0 0 0  Risk for fall due to : No Fall Risks No Fall Risks No Fall Risks No Fall Risks  Follow up Falls evaluation completed Falls evaluation completed Falls evaluation completed Falls evaluation completed     Objective:  Adam Parrish seemed alert and oriented and he participated appropriately during our telephone visit.  Blood Pressure Weight BMI  BP Readings from Last 3 Encounters:  01/27/23 122/80  01/19/23 118/80  08/10/22 128/83   Wt Readings from Last 3 Encounters:  01/27/23 225 lb (102.1 kg)  01/19/23 222 lb 6.4 oz (100.9 kg)  08/10/22 220 lb (99.8 kg)   BMI Readings from Last 1 Encounters:  01/27/23 29.28 kg/m    *Unable to obtain current vital signs, weight, and BMI due to  telephone visit type  Hearing/Vision  Adam Parrish did not seem to have difficulty with hearing/understanding during the telephone conversation Reports that he has had a formal eye exam by an eye care professional within the past year Reports that he has had a formal hearing evaluation within the past year *Unable to fully assess hearing and vision during telephone visit type  Cognitive Function:    02/04/2023   11:14 AM  6CIT Screen  What Year? 0 points  What month? 0 points  What time? 0 points  Count back from 20 0 points  Months in reverse 0 points  Repeat phrase 0 points  Total Score 0 points   (Normal:0-7, Significant for Dysfunction: >8)  Normal Cognitive Function Screening: Yes   Immunization & Health Maintenance Record Immunization History  Administered Date(s) Administered   PFIZER(Purple Top)SARS-COV-2 Vaccination 04/18/2020   Pneumococcal Conjugate-13 06/21/2015   Pneumococcal Polysaccharide-23 07/16/2016   Td 07/16/2016   Tdap 07/31/2006   Zoster, Live 09/15/2007    Health Maintenance  Topic Date Due   COVID-19 Vaccine (2 - Pfizer risk series) 04/29/2023 (Originally 05/09/2020)   Zoster Vaccines- Shingrix (1 of 2) 04/29/2023 (Originally 01/07/1965)   INFLUENZA VACCINE  03/19/2023   Medicare Annual Wellness (AWV)  02/04/2024   DTaP/Tdap/Td (3 - Td or Tdap) 07/16/2026   Pneumonia Vaccine 49+ Years old  Completed   Hepatitis C Screening  Completed   HPV VACCINES  Aged Out       Assessment  This is a routine wellness examination for Adam Parrish.  Health Maintenance: Due or Overdue There are no preventive care reminders to display for this patient.  Adam Parrish does not need a referral for Community Assistance: Care Management:   no Social Work:    no Prescription Assistance:  no Nutrition/Diabetes Education:  no   Plan:  Personalized  Goals  Goals Addressed               This Visit's Progress     Patient Stated (pt-stated)         Patient stated that he would like to loose 10 lbs.       Personalized Health Maintenance & Screening Recommendations  Shingles vaccine- need records  Lung Cancer Screening Recommended: no (Low Dose CT Chest recommended if Age 77-80 years, 20 pack-year currently smoking OR have quit w/in past 15 years) Hepatitis C Screening recommended: no HIV Screening recommended: no  Advanced Directives: Written information was not prepared per patient's request.  Referrals & Orders No orders of the defined types were placed in this encounter.   Follow-up Plan Follow-up with Everrett Coombe, DO as planned Needs records for shingles vaccine. Medicare wellness visit in one year.  Patient will access AVS on my chart.   I have personally reviewed and noted the following in the patient's chart:   Medical and social history Use of alcohol, tobacco or illicit drugs  Current medications and supplements Functional ability and status Nutritional status Physical activity Advanced directives List of other physicians Hospitalizations, surgeries, and ER visits in previous 12 months Vitals Screenings to include cognitive, depression, and falls Referrals and appointments  In addition, I have reviewed and discussed with Adam Parrish certain preventive protocols, quality metrics, and best practice recommendations. A written personalized care plan for preventive services as well as general preventive health recommendations is available and can be mailed to the patient at his request.      Modesto Charon, RN BSN  02/04/2023

## 2023-03-23 ENCOUNTER — Other Ambulatory Visit: Payer: Self-pay | Admitting: Cardiovascular Disease

## 2023-03-30 ENCOUNTER — Other Ambulatory Visit: Payer: Self-pay

## 2023-03-30 MED ORDER — METOPROLOL TARTRATE 50 MG PO TABS: 50.0000 mg | ORAL_TABLET | Freq: Two times a day (BID) | ORAL | 0 refills | Status: DC

## 2023-03-30 NOTE — Telephone Encounter (Signed)
Pt's wife calling requesting a refill on metoprolol be sent to local pharmacy for a 15 day supply until mail order arrives. Rx sent to local pharmacy as requested. Confirmation received.

## 2023-05-27 DIAGNOSIS — L821 Other seborrheic keratosis: Secondary | ICD-10-CM | POA: Diagnosis not present

## 2023-05-27 DIAGNOSIS — L814 Other melanin hyperpigmentation: Secondary | ICD-10-CM | POA: Diagnosis not present

## 2023-05-27 DIAGNOSIS — L578 Other skin changes due to chronic exposure to nonionizing radiation: Secondary | ICD-10-CM | POA: Diagnosis not present

## 2023-05-27 DIAGNOSIS — L570 Actinic keratosis: Secondary | ICD-10-CM | POA: Diagnosis not present

## 2023-05-27 DIAGNOSIS — D225 Melanocytic nevi of trunk: Secondary | ICD-10-CM | POA: Diagnosis not present

## 2023-05-27 DIAGNOSIS — D2372 Other benign neoplasm of skin of left lower limb, including hip: Secondary | ICD-10-CM | POA: Diagnosis not present

## 2023-05-31 ENCOUNTER — Other Ambulatory Visit: Payer: Self-pay | Admitting: Cardiovascular Disease

## 2023-05-31 DIAGNOSIS — I482 Chronic atrial fibrillation, unspecified: Secondary | ICD-10-CM

## 2023-06-01 NOTE — Telephone Encounter (Signed)
Prescription refill request for Eliquis received. Indication:afib Last office visit:6/24 Scr:0.92  6/24 Age: 77 Weight:102.1  kg  Prescription refilled

## 2023-09-17 DIAGNOSIS — H2513 Age-related nuclear cataract, bilateral: Secondary | ICD-10-CM | POA: Diagnosis not present

## 2023-09-17 DIAGNOSIS — H02834 Dermatochalasis of left upper eyelid: Secondary | ICD-10-CM | POA: Diagnosis not present

## 2023-09-17 DIAGNOSIS — H43812 Vitreous degeneration, left eye: Secondary | ICD-10-CM | POA: Diagnosis not present

## 2023-09-17 DIAGNOSIS — H02831 Dermatochalasis of right upper eyelid: Secondary | ICD-10-CM | POA: Diagnosis not present

## 2023-09-20 ENCOUNTER — Other Ambulatory Visit: Payer: Self-pay | Admitting: Cardiovascular Disease

## 2023-10-06 DIAGNOSIS — C44311 Basal cell carcinoma of skin of nose: Secondary | ICD-10-CM | POA: Diagnosis not present

## 2023-10-06 DIAGNOSIS — D485 Neoplasm of uncertain behavior of skin: Secondary | ICD-10-CM | POA: Diagnosis not present

## 2023-12-09 DIAGNOSIS — C44311 Basal cell carcinoma of skin of nose: Secondary | ICD-10-CM | POA: Diagnosis not present

## 2023-12-10 ENCOUNTER — Encounter: Payer: Self-pay | Admitting: Family Medicine

## 2023-12-10 ENCOUNTER — Ambulatory Visit (INDEPENDENT_AMBULATORY_CARE_PROVIDER_SITE_OTHER): Admitting: Family Medicine

## 2023-12-10 ENCOUNTER — Ambulatory Visit: Payer: Self-pay

## 2023-12-10 VITALS — BP 106/72 | HR 83 | Ht 73.5 in | Wt 224.0 lb

## 2023-12-10 DIAGNOSIS — J329 Chronic sinusitis, unspecified: Secondary | ICD-10-CM | POA: Diagnosis not present

## 2023-12-10 DIAGNOSIS — J40 Bronchitis, not specified as acute or chronic: Secondary | ICD-10-CM | POA: Diagnosis not present

## 2023-12-10 MED ORDER — DOXYCYCLINE HYCLATE 100 MG PO TABS: 100.0000 mg | ORAL_TABLET | Freq: Two times a day (BID) | ORAL | 0 refills | Status: DC

## 2023-12-10 MED ORDER — BENZONATATE 200 MG PO CAPS: 200.0000 mg | ORAL_CAPSULE | Freq: Two times a day (BID) | ORAL | 0 refills | Status: DC | PRN

## 2023-12-10 MED ORDER — PREDNISONE 20 MG PO TABS: 20.0000 mg | ORAL_TABLET | Freq: Two times a day (BID) | ORAL | 0 refills | Status: AC

## 2023-12-10 NOTE — Telephone Encounter (Signed)
 Patient seen in office today by Dr. Augustus Ledger

## 2023-12-10 NOTE — Assessment & Plan Note (Signed)
 Treating with course of doxycycline  and prednisone  burst.  Tessalon  perles as needed.  Continue supportive care.  Red flags reviewed.

## 2023-12-10 NOTE — Progress Notes (Signed)
 Adam Parrish - 78 y.o. male MRN [id number]  Date of birth: [birth year]  Subjective Chief Complaint  Patient presents with   Cough    HPI Adam Parrish is a 78 y.o. male here today with cough.. He has had cough for a few weeks.  He denies wheezing or dyspnea.  He has had some nasal congestion with dark yellow/brown mucus and sinus pressure.  He does have history of A. Fib but denies history of CHF.  He has tried coricidin with some relief.    ROS:  A comprehensive ROS was completed and negative except as noted per HPI  Allergies  Allergen Reactions   Shellfish Allergy Nausea And Vomiting, Swelling and Rash    Past Medical History:  Diagnosis Date   Atrial fibrillation (HCC)    Chronic    Chronic anticoagulation    [facility]ed [facility]   Erectile dysfunction     Past Surgical History:  Procedure Laterality Date   CARDIOVASCULAR STRESS TEST  10-15-2005   EF 54%   CIRCUMCISION REVISION  1986   US  ECHOCARDIOGRAPHY  07-24-2004   EF 55-60%   VASECTOMY REVERSAL  1986    Social History   Socioeconomic History   Marital status: Married    Spouse name: Aero Drummonds   Number of children: 7   Years of education: 14   Highest education level: Associate degree: occupational, Scientist, product/process development, or vocational program  Occupational History   Occupation: Self-Employed   Occupation: working full time  Tobacco Use   Smoking status: Former    Current packs/day: 0.00    Average packs/day: 1 pack/day for 20.0 years (20.0 ttl pk-yrs)    Types: Cigarettes    Start date: 08/19/1955    Quit date: 08/19/1975    Years since quitting: 48.3   Smokeless tobacco: Never  Vaping Use   Vaping status: Never Used  Substance and Sexual Activity   Alcohol use: Yes    Alcohol/week: 7.0 standard drinks of alcohol    Types: 7 Cans of beer per week    Comment: Daily   Drug use: Never   Sexual activity: Yes    Partners: Female  Other Topics Concern   Not on file  Social History Narrative   Lives  with spouse. He has seven children. He enjoys hunting, fishing and golfing.   Social Drivers of Corporate investment banker Strain: Low Risk  (02/04/2023)   Overall Financial Resource Strain (CARDIA)    Difficulty of Paying Living Expenses: Not hard at all  Food Insecurity: No Food Insecurity (02/04/2023)   Hunger Vital Sign    Worried About Running Out of Food in the Last Year: Never true    Ran Out of Food in the Last Year: Never true  Transportation Needs: No Transportation Needs (02/04/2023)   PRAPARE - Administrator, Civil Service (Medical): No    Lack of Transportation (Non-Medical): No  Physical Activity: Sufficiently Active (02/04/2023)   Exercise Vital Sign    Days of Exercise per Week: 4 days    Minutes of Exercise per Session: 50 min  Stress: No Stress Concern Present (02/04/2023)   [employer] of Occupational Health - Occupational Stress Questionnaire    Feeling of Stress : Not at all  Social Connections: Socially Integrated (02/04/2023)   Social Connection and Isolation Panel [NHANES]    Frequency of Communication with Friends and Family: More than three times a week    Frequency of Social Gatherings with  Friends and Family: Three times a week    Attends Religious Services: More than 4 times per year    Active Member of Clubs or Organizations: Yes    Attends Banker Meetings: More than 4 times per year    Marital Status: Married    Family History  Problem Relation Age of Onset   Alzheimer's disease Mother    Prostate cancer Father     Health Maintenance  Topic Date Due   Zoster Vaccines- Shingrix (1 of 2) 01/07/1965   COVID-19 Vaccine (2 - Pfizer risk series) 05/09/2020   Medicare Annual Wellness (AWV)  02/04/2024   INFLUENZA VACCINE  03/18/2024   DTaP/Tdap/Td (3 - Td or Tdap) 07/16/2026   Pneumonia Vaccine 97+ Years old  Completed   Hepatitis C Screening  Completed   HPV VACCINES  Aged Out   Meningococcal B Vaccine  Aged Out      ----------------------------------------------------------------------------------------------------------------------------------------------------------------------------------------------------------------- Physical Exam BP 106/72 (BP Location: Left Arm, Patient Position: Sitting, Cuff Size: Large)   Pulse 83   Ht 6' 1.5" (1.867 m)   Wt 224 lb (101.6 kg)   SpO2 95%   BMI 29.15 kg/m   Physical Exam Constitutional:      Appearance: Normal appearance.  HENT:     Head: Normocephalic and atraumatic.  Eyes:     General: No scleral icterus. Cardiovascular:     Rate and Rhythm: Normal rate and regular rhythm.  Pulmonary:     Effort: Pulmonary effort is normal.     Breath sounds: Normal breath sounds.  Neurological:     Mental Status: He is alert.  Psychiatric:        Mood and Affect: Mood normal.        Behavior: Behavior normal.     ------------------------------------------------------------------------------------------------------------------------------------------------------------------------------------------------------------------- Assessment and Plan  Sinobronchitis Treating with course of doxycycline  and prednisone  burst.  Tessalon  perles as needed.  Continue supportive care.  Red flags reviewed.     Meds ordered this encounter  Medications   doxycycline  (VIBRA -TABS) 100 MG tablet    Sig: Take 1 tablet (100 mg total) by mouth 2 (two) times daily.    Dispense:  20 tablet    Refill:  0   predniSONE  (DELTASONE ) 20 MG tablet    Sig: Take 1 tablet (20 mg total) by mouth 2 (two) times daily with a meal for 5 days.    Dispense:  10 tablet    Refill:  0   benzonatate  (TESSALON ) 200 MG capsule    Sig: Take 1 capsule (200 mg total) by mouth 2 (two) times daily as needed for cough.    Dispense:  20 capsule    Refill:  0    No follow-ups on file.

## 2023-12-10 NOTE — Telephone Encounter (Signed)
 Chief Complaint: Cough Symptoms: Cough, runny nose Frequency: 10 days Pertinent Negatives: Patient denies fever, phlegm Disposition: [] ED /[] Urgent Care (no appt availability in office) / [x] Appointment(In office/virtual)/ []  [facility]/ [] Home Care/ [] Refused Recommended Disposition /[] [facility]/ []  Follow-up with PCP Additional Notes: Patient's wife called on behalf of patient to make an appt to see PCP, stating patient has had ongoing cough for 10 days. Patient is not able to sleep through the night and is having to sleep sitting up. Patient denies fever and phlegm with cough. Wife has concern for pneumonia. Appt made for today for evaluation.    Copied from CRM [id number]. Topic: Clinical - Red Word Triage >> Dec 10, 2023  8:19 AM Blair Bumpers wrote: Red Word that prompted transfer to Nurse Triage: Patient's wife, Felipa Horsfall, calling in stating that patient has had a cough for over 10 days now & can't get rid of it. She states there is no phlegm or fever. Patient got up at 2AM this morning & slept in chair. He is using Nyquil, dayquil and other medications and nothing is working. Wants to be seen by Dr. Augustus Ledger or a PA. Reason for Disposition  [1] Continuous (nonstop) coughing interferes with work or school AND [2] no improvement using cough treatment per Care Advice  Answer Assessment - Initial Assessment Questions 1. ONSET: "When did the cough begin?"      10 days 2. SEVERITY: "How bad is the cough today?"      Patient unable to sleep 3. SPUTUM: "Describe the color of your sputum" (none, dry cough; clear, white, yellow, green)     None 4. HEMOPTYSIS: "Are you coughing up any blood?" If so ask: "How much?" (flecks, streaks, tablespoons, etc.)     None 5. DIFFICULTY BREATHING: "Are you having difficulty breathing?" If Yes, ask: "How bad is it?" (e.g., mild, moderate, severe)    - MILD: No SOB at rest, mild SOB with walking, speaks normally in sentences, can lie down,  no retractions, pulse < 100.    - MODERATE: SOB at rest, SOB with minimal exertion and prefers to sit, cannot lie down flat, speaks in phrases, mild retractions, audible wheezing, pulse 100-120.    - SEVERE: Very SOB at rest, speaks in single words, struggling to breathe, sitting hunched forward, retractions, pulse > 120      Not that patient's wife is aware of 6. FEVER: "Do you have a fever?" If Yes, ask: "What is your temperature, how was it measured, and when did it start?"     No 7. CARDIAC HISTORY: "Do you have any history of heart disease?" (e.g., heart attack, congestive heart failure)      A-Fib 8. LUNG HISTORY: "Do you have any history of lung disease?"  (e.g., pulmonary embolus, asthma, emphysema)     No 9. PE RISK FACTORS: "Do you have a history of blood clots?" (or: recent major surgery, recent prolonged travel, bedridden)     No 10. OTHER SYMPTOMS: "Do you have any other symptoms?" (e.g., runny nose, wheezing, chest pain)       Runny nose  Protocols used: Cough - Acute Non-Productive-A-AH

## 2024-01-07 ENCOUNTER — Other Ambulatory Visit: Payer: Self-pay | Admitting: Cardiovascular Disease

## 2024-02-09 ENCOUNTER — Ambulatory Visit: Payer: Medicare Other

## 2024-02-09 VITALS — Ht 73.0 in | Wt 220.0 lb

## 2024-02-09 DIAGNOSIS — Z0000 Encounter for general adult medical examination without abnormal findings: Secondary | ICD-10-CM

## 2024-02-09 NOTE — Progress Notes (Signed)
 Subjective:   Adam Parrish is a 78 y.o. male who presents for Medicare Annual/Subsequent preventive examination.  Visit Complete: Virtual I connected with  Adam Parrish on 02/09/24 by a audio enabled telemedicine application and verified that I am speaking with the correct person using two identifiers.  Patient Location: Home  Provider Location: Office/Clinic  I discussed the limitations of evaluation and management by telemedicine. The patient expressed understanding and agreed to proceed.  Vital Signs: Because this visit was a virtual/telehealth visit, some criteria may be missing or patient reported. Any vitals not documented were not able to be obtained and vitals that have been documented are patient reported.  Patient Medicare AWV questionnaire was completed by the patient on n/a; I have confirmed that all information answered by patient is correct and no changes since this date.  Cardiac Risk Factors include: advanced age (>90men, >40 women);male gender;smoking/ tobacco exposure     Objective:    Today's Vitals   02/09/24 1100  Weight: 220 lb (99.8 kg)  Height: 6' 1 (1.854 m)   Body mass index is 29.03 kg/m.     02/09/2024   11:08 AM 02/04/2023   11:10 AM 12/20/2020   10:23 AM  Advanced Directives  Does Patient Have a Medical Advance Directive? Yes Yes Yes  Type of Estate agent of [place];Living will Living will Healthcare Power of Attorney  Does patient want to make changes to medical advance directive? No - Patient declined No - Patient declined No - Patient declined  Copy of Healthcare Power of Attorney in Chart? No - copy requested  Yes - validated most recent copy scanned in chart (See row information)    Current Medications (verified) Outpatient Encounter Medications as of 02/09/2024  Medication Sig   cyanocobalamin (VITAMIN B12) 1000 MCG tablet Take 1,000 mcg by mouth daily.   ELIQUIS  5 MG TABS tablet TAKE 1 TABLET BY MOUTH TWICE   DAILY   metoprolol  tartrate (LOPRESSOR ) 50 MG tablet TAKE 1 TABLET BY MOUTH TWICE  DAILY   Misc Natural Products (SAW PALMETTO PLUS) CAPS Take 1 capsule by mouth daily at 12 noon.   Multiple Vitamin (CALCIUM COMPLEX PO) Take 1 tablet by mouth daily.   [DISCONTINUED] benzonatate  (TESSALON ) 200 MG capsule Take 1 capsule (200 mg total) by mouth 2 (two) times daily as needed for cough.   [DISCONTINUED] doxycycline  (VIBRA -TABS) 100 MG tablet Take 1 tablet (100 mg total) by mouth 2 (two) times daily.   No facility-administered encounter medications on file as of 02/09/2024.    Allergies (verified) Shellfish allergy   History: Past Medical History:  Diagnosis Date   Atrial fibrillation (HCC)    Chronic    Chronic anticoagulation    followed at [facility]   Erectile dysfunction    Past Surgical History:  Procedure Laterality Date   CARDIOVASCULAR STRESS TEST  10-15-2005   EF 54%   CIRCUMCISION REVISION  1986   US  ECHOCARDIOGRAPHY  07-24-2004   EF 55-60%   VASECTOMY REVERSAL  1986   Family History  Problem Relation Age of Onset   Alzheimer's disease Mother    Prostate cancer Father    Social History   Socioeconomic History   Marital status: Married    Spouse name: Kortney Schoenfelder   Number of children: 7   Years of education: 14   Highest education level: Associate degree: occupational, Scientist, product/process development, or vocational program  Occupational History   Occupation: Self-Employed   Occupation: working full time  Tobacco Use   Smoking status: Former    Current packs/day: 0.00    Average packs/day: 1 pack/day for 20.0 years (20.0 ttl pk-yrs)    Types: Cigarettes    Start date: 08/19/1955    Quit date: 08/19/1975    Years since quitting: 48.5   Smokeless tobacco: Never  Vaping Use   Vaping status: Never Used  Substance and Sexual Activity   Alcohol use: Yes    Alcohol/week: 7.0 standard drinks of alcohol    Types: 7 Cans of beer per week    Comment: Daily   Drug use: Never   Sexual  activity: Yes    Partners: Female  Other Topics Concern   Not on file  Social History Narrative   Lives with spouse. He has seven children. He enjoys hunting, fishing and golfing.   Social Drivers of Corporate investment banker Strain: Low Risk  (02/09/2024)   Overall Financial Resource Strain (CARDIA)    Difficulty of Paying Living Expenses: Not hard at all  Food Insecurity: No Food Insecurity (02/09/2024)   Hunger Vital Sign    Worried About Running Out of Food in the Last Year: Never true    Ran Out of Food in the Last Year: Never true  Transportation Needs: No Transportation Needs (02/09/2024)   PRAPARE - Administrator, Civil Service (Medical): No    Lack of Transportation (Non-Medical): No  Physical Activity: Sufficiently Active (02/09/2024)   Exercise Vital Sign    Days of Exercise per Week: 3 days    Minutes of Exercise per Session: 60 min  Stress: No Stress Concern Present (02/09/2024)   [employer] of Occupational Health - Occupational Stress Questionnaire    Feeling of Stress: Not at all  Social Connections: Socially Integrated (02/09/2024)   Social Connection and Isolation Panel    Frequency of Communication with Friends and Family: More than three times a week    Frequency of Social Gatherings with Friends and Family: More than three times a week    Attends Religious Services: More than 4 times per year    Active Member of [employer] or Organizations: Yes    Attends Engineer, structural: More than 4 times per year    Marital Status: Married    Tobacco Counseling Counseling given: Not Answered   Clinical Intake:  Pre-visit preparation completed: Yes  Pain : No/denies pain     BMI - recorded: 29.03 Nutritional Status: BMI 25 -29 Overweight Nutritional Risks: None Diabetes: No  How often do you need to have someone help you when you read instructions, pamphlets, or other written materials from your doctor or pharmacy?: 1 - Never What is  the last grade level you completed in school?: 14  Interpreter Needed?: No      Activities of Daily Living    02/09/2024   11:02 AM  In your present state of health, do you have any difficulty performing the following activities:  Hearing? 0  Vision? 0  Difficulty concentrating or making decisions? 0  Walking or climbing stairs? 0  Dressing or bathing? 0  Doing errands, shopping? 0  Preparing Food and eating ? N  Using the Toilet? N  In the past six months, have you accidently leaked urine? N  Do you have problems with loss of bowel control? N  Managing your Medications? N  Managing your Finances? N  Housekeeping or managing your Housekeeping? N    Patient Care Team: Alvia Bring, DO as  PCP - General (Family Medicine) Lelon Glendia ONEIDA DEVONNA as Physician Assistant (Cardiology)  Indicate any recent Medical Services you may have received from other than [facility] providers in the past year (date may be approximate).     Assessment:   This is a routine wellness examination for Adam Parrish.  Hearing/Vision screen No results found.   Goals Addressed             This Visit's Progress    Patient Stated       Patient states he wants to remain flexible and stay healthy.        Depression Screen    02/09/2024   11:07 AM 02/04/2023   11:10 AM 01/27/2023    9:46 AM 03/12/2021    8:41 AM 12/20/2020   10:35 AM  PHQ 2/9 Scores  PHQ - 2 Score 0 0 0 0 1  PHQ- 9 Score     5    Fall Risk    02/09/2024   11:09 AM 02/04/2023   11:10 AM 01/27/2023    9:46 AM 03/12/2021    8:41 AM 12/20/2020   10:35 AM  Fall Risk   Falls in the past year? 0 0 0 1 0  Number falls in past yr: 0 0 0 0 0  Injury with Fall? 0 0 0 0 0  Risk for fall due to : No Fall Risks No Fall Risks No Fall Risks No Fall Risks No Fall Risks  Follow up Falls evaluation completed Falls evaluation completed Falls evaluation completed Falls evaluation completed  Falls evaluation completed      Data saved with a previous  flowsheet row definition    MEDICARE RISK AT HOME: Medicare Risk at Home Any stairs in or around the home?: No Home free of loose throw rugs in walkways, pet beds, electrical cords, etc?: Yes Adequate lighting in your home to reduce risk of falls?: Yes Life alert?: No Use of a cane, walker or w/c?: No Grab bars in the bathroom?: Yes Shower chair or bench in shower?: No Elevated toilet seat or a handicapped toilet?: Yes  TIMED UP AND GO:  Was the test performed?  No    Cognitive Function:        02/09/2024   11:10 AM 02/04/2023   11:14 AM  6CIT Screen  What Year? 0 points 0 points  What month? 0 points 0 points  What time? 0 points 0 points  Count back from 20 0 points 0 points  Months in reverse 0 points 0 points  Repeat phrase 0 points 0 points  Total Score 0 points 0 points    Immunizations Immunization History  Administered Date(s) Administered   PFIZER(Purple Top)SARS-COV-2 Vaccination 04/18/2020   Pneumococcal Conjugate-13 06/21/2015   Pneumococcal Polysaccharide-23 07/16/2016   Td 07/16/2016   Tdap 07/31/2006   Zoster, Live 09/15/2007    TDAP status: Due, Education has been provided regarding the importance of this vaccine. Advised may receive this vaccine at local pharmacy or Health Dept. Aware to provide a copy of the vaccination record if obtained from local pharmacy or Health Dept. Verbalized acceptance and understanding.  Flu Vaccine status: Declined, Education has been provided regarding the importance of this vaccine but patient still declined. Advised may receive this vaccine at local pharmacy or Health Dept. Aware to provide a copy of the vaccination record if obtained from local pharmacy or Health Dept. Verbalized acceptance and understanding.  Pneumococcal vaccine status: Up to date  Covid-19 vaccine status: Declined, Education  has been provided regarding the importance of this vaccine but patient still declined. Advised may receive this vaccine at  local pharmacy or Health Dept.or vaccine clinic. Aware to provide a copy of the vaccination record if obtained from local pharmacy or Health Dept. Verbalized acceptance and understanding.  Qualifies for Shingles Vaccine? Yes   Zostavax completed Yes   Shingrix Completed?: No.    Education has been provided regarding the importance of this vaccine. Patient has been advised to call insurance company to determine out of pocket expense if they have not yet received this vaccine. Advised may also receive vaccine at local pharmacy or Health Dept. Verbalized acceptance and understanding.  Screening Tests Health Maintenance  Topic Date Due   Zoster Vaccines- Shingrix (1 of 2) 01/07/1965   COVID-19 Vaccine (2 - Pfizer risk series) 05/09/2020   INFLUENZA VACCINE  03/18/2024   Medicare Annual Wellness (AWV)  02/08/2025   DTaP/Tdap/Td (3 - Td or Tdap) 07/16/2026   Pneumococcal Vaccine: 50+ Years  Completed   Hepatitis C Screening  Completed   Hepatitis B Vaccines  Aged Out   HPV VACCINES  Aged Out   Meningococcal B Vaccine  Aged Out    Health Maintenance  Health Maintenance Due  Topic Date Due   Zoster Vaccines- Shingrix (1 of 2) 01/07/1965   COVID-19 Vaccine (2 - Pfizer risk series) 05/09/2020    Colorectal cancer screening: No longer required.   Lung Cancer Screening: (Low Dose CT Chest recommended if Age 63-80 years, 20 pack-year currently smoking OR have quit w/in 15years.) does not qualify.   Lung Cancer Screening Referral: n/a  Additional Screening:  Hepatitis C Screening: does not qualify; Completed 02/18/2020  Vision Screening: Recommended annual ophthalmology exams for early detection of glaucoma and other disorders of the eye. Is the patient up to date with their annual eye exam?  Yes  Who is the provider or what is the name of the office in which the patient attends annual eye exams? Dr Camillo If pt is not established with a provider, would they like to be referred to a provider  to establish care? N/a.   Dental Screening: Recommended annual dental exams for proper oral hygiene   Community Resource Referral / Chronic Care Management: CRR required this visit?  No   CCM required this visit?  No     Plan:     I have personally reviewed and noted the following in the patient's chart:   Medical and social history Use of alcohol, tobacco or illicit drugs  Current medications and supplements including opioid prescriptions. Patient is not currently taking opioid prescriptions. Functional ability and status Nutritional status Physical activity Advanced directives List of other physicians Hospitalizations, surgeries, and ER visits in previous 12 months Vitals Screenings to include cognitive, depression, and falls Referrals and appointments  In addition, I have reviewed and discussed with patient certain preventive protocols, quality metrics, and best practice recommendations. A written personalized care plan for preventive services as well as general preventive health recommendations were provided to patient.     Adam Parrish, CMA   02/09/2024   After Visit Summary: (MyChart) Due to this being a telephonic visit, the after visit summary with patients personalized plan was offered to patient via MyChart   Nurse Notes:   Adam Parrish is a 78 y.o. male patient of Alvia Bring, DO who had a Medicare Annual Wellness Visit today via telephone. Adam Parrish is Working full time and lives with their spouse. he has  7 children. He reports that he is socially active and does interact with friends/family regularly. He is moderately physically active and enjoys hunting, fishing and golfing.

## 2024-02-09 NOTE — Patient Instructions (Signed)
  Adam Parrish , Thank you for taking time to come for your Medicare Wellness Visit. I appreciate your ongoing commitment to your health goals. Please review the following plan we discussed and let me know if I can assist you in the future.   These are the goals we discussed:  Goals       Patient Stated (pt-stated)      Patient stated that he would like to loose 10 lbs.      Patient Stated      Patient states he wants to remain flexible and stay healthy.         This is a list of the screening recommended for you and due dates:  Health Maintenance  Topic Date Due   Zoster (Shingles) Vaccine (1 of 2) 01/07/1965   COVID-19 Vaccine (2 - Pfizer risk series) 05/09/2020   Flu Shot  03/18/2024   Medicare Annual Wellness Visit  02/08/2025   DTaP/Tdap/Td vaccine (3 - Td or Tdap) 07/16/2026   Pneumococcal Vaccine for age over 65  Completed   Hepatitis C Screening  Completed   Hepatitis B Vaccine  Aged Out   HPV Vaccine  Aged Out   Meningitis B Vaccine  Aged Out

## 2024-02-10 DIAGNOSIS — L905 Scar conditions and fibrosis of skin: Secondary | ICD-10-CM | POA: Diagnosis not present

## 2024-03-15 NOTE — Progress Notes (Unsigned)
 OFFICE NOTE:    Date:  03/16/2024  ID:  Adam Parrish, DOB 21-Jul-1946, MRN [id number] PCP: Alvia Bring, DO  [facility] Providers Cardiologist:  None Cardiology APP:  Lelon Hamilton T, PA-C       Permanent atrial fibrillation Myoview  07/13/2018: EF 45, apical thinning, no ischemia, low risk TTE 08/30/2018: EF 60-65, no LVH, no RWMA, mild MR, mild-moderate LAE, mild RAE Hypertension        Discussed the use of AI scribe software for clinical note transcription with the patient, who gave verbal consent to proceed. History of Present Illness Adam Parrish is a 78 y.o. male who returns for follow up of atrial fibrillation.   No chest pain, shortness of breath, syncope, unusual heart sensations, or excessive fatigue. He has concerns about his declining sex life over the past year, attributing it to long-term use of metoprolol . He has tried Viagra, Cialis , and Levitra  but experienced side effects like headaches. He recently vacationed in [place].  He has a Gaffer business and remains very busy.   Review of Systems  Gastrointestinal:  Negative for hematochezia and melena.  Genitourinary:  Negative for hematuria.  -See HPI    Studies Reviewed:  EKG Interpretation Date/Time:  Wednesday March 16 2024 08:03:18 EDT Ventricular Rate:  65 PR Interval:    QRS Duration:  68 QT Interval:  406 QTC Calculation: 422 R Axis:   0  Text Interpretation: Atrial fibrillation Confirmed by Lelon Hamilton [phone]) on 03/16/2024 8:24:23 AM   Labs 01/27/23: K 4.8, SCr 0.92, ALT 30, TC 169, Tg 81, HDL 59, LDL 93, Hgb 14.5, TSH 3.85 Results DIAGNOSTIC ECG: Atrial fibrillation Risk Assessment/Calculations:  CHA2DS2-VASc Score = 3   This indicates a 3.2% annual risk of stroke. The patient's score is based upon: CHF History: 0 HTN History: 1 Diabetes History: 0 Stroke History: 0 Vascular Disease History: 0 Age Score: 2 Gender Score: 0           Physical Exam:  VS:  BP  102/70   Pulse 65   Ht 6' 1.5 (1.867 m)   Wt 227 lb 6.4 oz (103.1 kg)   SpO2 98%   BMI 29.60 kg/m       Wt Readings from Last 3 Encounters:  03/16/24 227 lb 6.4 oz (103.1 kg)  02/09/24 220 lb (99.8 kg)  12/10/23 224 lb (101.6 kg)    Constitutional:      Appearance: Healthy appearance. Not in distress.  Neck:     Vascular: JVD normal.  Pulmonary:     Breath sounds: Normal breath sounds. No wheezing. No rales.  Cardiovascular:     Normal rate. Irregularly irregular rhythm.     Murmurs: There is no murmur.  Edema:    Peripheral edema absent.  Abdominal:     Palpations: Abdomen is soft.     Assessment and Plan:    Assessment & Plan Permanent atrial fibrillation [facility]) He remains asymptomatic.  He is tolerating anticoagulation well.  Rate is controlled.  Continue metoprolol  tartrate 50 mg twice daily, Eliquis  5 mg twice daily.  Obtain follow-up BMET, CBC today.  Follow-up 1 year. Essential hypertension Blood pressure well-controlled on current therapy. Erectile dysfunction, unspecified erectile dysfunction type He inquired about therapy for erectile dysfunction.  He is intolerant of PDE-5 inhibitors.  I offered to refer him to urology.  He will discuss this with his primary care provider.      Dispo:  Return in about 1 year (around 03/16/2025)  for Routine Follow Up, w/ Glendia Ferrier, PA-C.  Signed, Glendia Ferrier, PA-C

## 2024-03-16 ENCOUNTER — Encounter: Payer: Self-pay | Admitting: Physician Assistant

## 2024-03-16 ENCOUNTER — Ambulatory Visit: Attending: Cardiology | Admitting: Physician Assistant

## 2024-03-16 VITALS — BP 102/70 | HR 65 | Ht 73.5 in | Wt 227.4 lb

## 2024-03-16 DIAGNOSIS — I10 Essential (primary) hypertension: Secondary | ICD-10-CM

## 2024-03-16 DIAGNOSIS — I4821 Permanent atrial fibrillation: Secondary | ICD-10-CM

## 2024-03-16 DIAGNOSIS — N529 Male erectile dysfunction, unspecified: Secondary | ICD-10-CM | POA: Diagnosis not present

## 2024-03-16 LAB — CBC

## 2024-03-16 NOTE — Assessment & Plan Note (Signed)
Blood pressure well controlled on current therapy. ?

## 2024-03-16 NOTE — Assessment & Plan Note (Signed)
 He inquired about therapy for erectile dysfunction.  He is intolerant of PDE-5 inhibitors.  I offered to refer him to urology.  He will discuss this with his primary care provider.

## 2024-03-16 NOTE — Patient Instructions (Signed)
 Medication Instructions:  No changes. See med list *If you need a refill on your cardiac medications before your next appointment, please call your pharmacy*  Lab Work: Today - BMET, CBC  If you have labs (blood work) drawn today and your tests are completely normal, you will receive your results only by: MyChart Message (if you have MyChart) OR A paper copy in the mail If you have any lab test that is abnormal or we need to change your treatment, we will call you to review the results.  Follow-Up: At [facility], you and your health needs are our priority.  As part of our continuing mission to provide you with exceptional heart care, our providers are all part of one team.  This team includes your primary Cardiologist (physician) and Advanced Practice Providers or APPs (Physician Assistants and Nurse Practitioners) who all work together to provide you with the care you need, when you need it.  Your next appointment:   12 month(s)  Provider:   Glendia Ferrier, PA-C          We recommend signing up for the patient portal called MyChart.  Sign up information is provided on this After Visit Summary.  MyChart is used to connect with patients for Virtual Visits (Telemedicine).  Patients are able to view lab/test results, encounter notes, upcoming appointments, etc.  Non-urgent messages can be sent to your provider as well.   To learn more about what you can do with MyChart, go to [email or web address].   Other Instructions

## 2024-03-16 NOTE — Assessment & Plan Note (Signed)
 He remains asymptomatic.  He is tolerating anticoagulation well.  Rate is controlled.  Continue metoprolol  tartrate 50 mg twice daily, Eliquis  5 mg twice daily.  Obtain follow-up BMET, CBC today.  Follow-up 1 year.

## 2024-03-17 ENCOUNTER — Ambulatory Visit: Payer: Self-pay | Admitting: Physician Assistant

## 2024-03-17 LAB — CBC
Hematocrit: 43.9 (ref 37.5–51.0)
Hemoglobin: 14.2 g/dL (ref 13.0–17.7)
MCH: 31.1 pg (ref 26.6–33.0)
MCHC: 32.3 g/dL (ref 31.5–35.7)
MCV: 96 fL (ref 79–97)
Platelets: 235 x10E3/uL (ref 150–450)
RBC: 4.56 x10E6/uL (ref 4.14–5.80)
RDW: 13.8 (ref 11.6–15.4)
WBC: 6.3 x10E3/uL (ref 3.4–10.8)

## 2024-03-17 LAB — BASIC METABOLIC PANEL WITH GFR
BUN/Creatinine Ratio: 17 (ref 10–24)
BUN: 16 mg/dL (ref 8–27)
CO2: 23 mmol/L (ref 20–29)
Calcium: 9.4 mg/dL (ref 8.6–10.2)
Chloride: 101 mmol/L (ref 96–106)
Creatinine, Ser: 0.96 mg/dL (ref 0.76–1.27)
Glucose: 105 mg/dL — ABNORMAL HIGH (ref 70–99)
Potassium: 5 mmol/L (ref 3.5–5.2)
Sodium: 139 mmol/L (ref 134–144)
eGFR: 81 mL/min/1.73 (ref >59)

## 2024-03-22 NOTE — Progress Notes (Signed)
 Pt has been made aware of normal result and verbalized understanding.  jw

## 2024-05-20 ENCOUNTER — Other Ambulatory Visit: Payer: Self-pay | Admitting: Pharmacist

## 2024-05-20 DIAGNOSIS — I482 Chronic atrial fibrillation, unspecified: Secondary | ICD-10-CM

## 2024-05-20 MED ORDER — APIXABAN 5 MG PO TABS: 5.0000 mg | ORAL_TABLET | Freq: Two times a day (BID) | ORAL | 1 refills | Status: AC

## 2024-05-30 ENCOUNTER — Telehealth: Payer: Self-pay

## 2024-05-30 DIAGNOSIS — M25462 Effusion, left knee: Secondary | ICD-10-CM

## 2024-05-30 DIAGNOSIS — M25562 Pain in left knee: Secondary | ICD-10-CM

## 2024-05-30 NOTE — Telephone Encounter (Signed)
 Copied from CRM [id number]. Topic: Referral - Request for Referral >> May 30, 2024 10:38 AM Delon DASEN wrote: Did the patient discuss referral with their provider in the last year? Yes (If No - schedule appointment) (If Yes - send message)  Appointment offered? Yes  Type of order/referral and detailed reason for visit: ortho  Preference of office, provider, location: Dr Charlott - [facility] on [address]  If referral order, have you been seen by this specialty before? Yes (If Yes, this issue or another issue? When? Where? Dr T  Can we respond through MyChart? Yes- wife Heron would like a phone call - [phone]

## 2024-05-30 NOTE — Telephone Encounter (Signed)
 Would patient need an OV before we can send referral ? Last OV  was sick visit ( sinobronchitis) on 12/10/2023 And last seen by provider for 02/04/2023

## 2024-05-31 NOTE — Telephone Encounter (Signed)
 Spoke with patient wife Heron- was told that patient  has had swelling and water on knee  x 1 week - patient wife thinks this is his left knee.  Does the patient need OV with you before sending the referral ?

## 2024-06-01 ENCOUNTER — Telehealth: Payer: Self-pay | Admitting: Family Medicine

## 2024-06-01 NOTE — Telephone Encounter (Signed)
 Copied from CRM [id number]. Topic: Referral - Request for Referral >> May 30, 2024 10:38 AM Delon DASEN wrote: Did the patient discuss referral with their provider in the last year? Yes (If No - schedule appointment) (If Yes - send message)  Appointment offered? Yes  Type of order/referral and detailed reason for visit: ortho  Preference of office, provider, location: Dr Charlott - [facility] on [address]  If referral order, have you been seen by this specialty before? Yes (If Yes, this issue or another issue? When? Where? Dr T  Can we respond through MyChart? Yes- wife Heron would like a phone call - [phone] >> Jun 01, 2024  8:07 AM Emylou G wrote: Patient called back.. wants info on the referral we will be using.SABRA Pls call him back and okay to leave vmail

## 2024-06-01 NOTE — Telephone Encounter (Signed)
 Copied from CRM #[id number]. Topic: Referral - Question >> Jun 01, 2024  4:36 PM Adam Parrish wrote: Reason for CRM: The patient's wife has called for an update on the status of their previously requested referral to an orthopedic specialist for the patient's knee concerns. Please contact the patient and/or their spouse further when possible

## 2024-06-02 NOTE — Telephone Encounter (Signed)
 Returned patient's call. Advised Dr. Alvia placed referral. Office mailed patient a letter stating to allow 3 -5 business days for contact. Letter was dated 06/01/24.

## 2024-06-06 DIAGNOSIS — M1712 Unilateral primary osteoarthritis, left knee: Secondary | ICD-10-CM | POA: Diagnosis not present

## 2024-06-06 DIAGNOSIS — M25562 Pain in left knee: Secondary | ICD-10-CM | POA: Diagnosis not present

## 2024-06-07 DIAGNOSIS — Z85828 Personal history of other malignant neoplasm of skin: Secondary | ICD-10-CM | POA: Diagnosis not present

## 2024-06-07 DIAGNOSIS — L821 Other seborrheic keratosis: Secondary | ICD-10-CM | POA: Diagnosis not present

## 2024-06-07 DIAGNOSIS — L570 Actinic keratosis: Secondary | ICD-10-CM | POA: Diagnosis not present

## 2024-06-07 DIAGNOSIS — W908XXD Exposure to other nonionizing radiation, subsequent encounter: Secondary | ICD-10-CM | POA: Diagnosis not present

## 2024-06-07 DIAGNOSIS — L578 Other skin changes due to chronic exposure to nonionizing radiation: Secondary | ICD-10-CM | POA: Diagnosis not present

## 2024-06-07 DIAGNOSIS — Z08 Encounter for follow-up examination after completed treatment for malignant neoplasm: Secondary | ICD-10-CM | POA: Diagnosis not present

## 2024-06-13 ENCOUNTER — Ambulatory Visit
Admission: EM | Admit: 2024-06-13 | Discharge: 2024-06-13 | Disposition: A | Discharge status: Home / Self Care | Attending: Family Medicine | Admitting: Family Medicine

## 2024-06-13 ENCOUNTER — Ambulatory Visit: Payer: Self-pay

## 2024-06-13 DIAGNOSIS — I4821 Permanent atrial fibrillation: Secondary | ICD-10-CM | POA: Diagnosis not present

## 2024-06-13 DIAGNOSIS — R079 Chest pain, unspecified: Secondary | ICD-10-CM | POA: Diagnosis not present

## 2024-06-13 MED ORDER — ASPIRIN 81 MG PO CHEW
324.0000 mg | CHEWABLE_TABLET | Freq: Once | ORAL | Status: DC
Start: 2024-06-13 — End: 2024-06-13

## 2024-06-13 NOTE — ED Provider Notes (Signed)
 Adam Par[facility]    CSN: [id number] Arrival date & time: 06/13/24  1355      History   Chief Complaint Chief Complaint  Patient presents with   Chest Pain    HPI Adam Parrish is a 78 y.o. male.   HPI 78 year old male presents with chest pain for 1 hour.  PMH significant for permanent atrial fibrillation, HTN, and chronic anticoagulation.  EKG reveals atrial fibrillation.  Patient is currently on apixaban  and denies any unusual bleeding  Past Medical History:  Diagnosis Date   Atrial fibrillation (HCC)    Chronic    Chronic anticoagulation    followed at [facility]   Erectile dysfunction     Patient Active Problem List   Diagnosis Date Noted   Sinobronchitis 12/10/2023   Cerumen impaction 01/27/2023   Strain of latissimus dorsi muscle 02/12/2022   Well adult exam 03/12/2021   Secondary hypercoagulable state 12/20/2020   Actinic keratosis 10/11/2020   Essential hypertension 10/11/2020   Irregular heart beat 10/11/2020   Erectile dysfunction 04/16/2011   Atrial fibrillation (HCC) 11/21/2010    Past Surgical History:  Procedure Laterality Date   CARDIOVASCULAR STRESS TEST  10-15-2005   EF 54%   CIRCUMCISION REVISION  1986   US  ECHOCARDIOGRAPHY  07-24-2004   EF 55-60%   VASECTOMY REVERSAL  1986       Home Medications    Prior to Admission medications   Medication Sig Start Date End Date Taking? Authorizing Provider  apixaban  (ELIQUIS ) 5 MG TABS tablet Take 1 tablet (5 mg total) by mouth 2 (two) times daily. 05/20/24   O'NealDarryle Ned, MD  cyanocobalamin (VITAMIN B12) 1000 MCG tablet Take 1,000 mcg by mouth daily.    [provider]  metoprolol  tartrate (LOPRESSOR ) 50 MG tablet TAKE 1 TABLET BY MOUTH TWICE  DAILY 01/07/24   Nahser, Aleene PARAS, MD  Misc Natural Products (SAW PALMETTO PLUS) CAPS Take 1 capsule by mouth daily at 12 noon.    [provider]  Multiple Vitamin (CALCIUM COMPLEX PO) Take 1 tablet by mouth daily.     [provider]    Family History Family History  Problem Relation Age of Onset   Alzheimer's disease Mother    Prostate cancer Father     Social History Social History   Tobacco Use   Smoking status: Former    Current packs/day: 0.00    Average packs/day: 1 pack/day for 20.0 years (20.0 ttl pk-yrs)    Types: Cigarettes    Start date: 08/19/1955    Quit date: 08/19/1975    Years since quitting: 48.8   Smokeless tobacco: Never  Vaping Use   Vaping status: Never Used  Substance Use Topics   Alcohol use: Yes    Alcohol/week: 7.0 standard drinks of alcohol    Types: 7 Cans of beer per week    Comment: Daily   Drug use: Never     Allergies   Shellfish allergy   Review of Systems Review of Systems  Cardiovascular:  Positive for chest pain.  All other systems reviewed and are negative.    Physical Exam Triage Vital Signs ED Triage Vitals  Encounter Vitals Group     BP      Girls Systolic BP Percentile      Girls Diastolic BP Percentile      Boys Systolic BP Percentile      Boys Diastolic BP Percentile      Pulse  Resp      Temp      Temp src      SpO2      Weight      Height      Head Circumference      Peak Flow      Pain Score      Pain Loc      Pain Education      Exclude from Growth Chart    No data found.  Updated Vital Signs There were no vitals taken for this visit.  Physical Exam Vitals and nursing note reviewed.  Constitutional:      General: He is not in acute distress.    Appearance: Normal appearance. He is normal weight. He is not ill-appearing, toxic-appearing or diaphoretic.  HENT:     Head: Normocephalic and atraumatic.     Mouth/Throat:     Mouth: Mucous membranes are moist.     Pharynx: Oropharynx is clear.  Eyes:     Extraocular Movements: Extraocular movements intact.     Conjunctiva/sclera: Conjunctivae normal.     Pupils: Pupils are equal, round, and reactive to light.  Cardiovascular:     Pulses: Normal  pulses.     Comments: Irregularly irregular Pulmonary:     Effort: Pulmonary effort is normal.     Breath sounds: Normal breath sounds. No wheezing, rhonchi or rales.  Musculoskeletal:        General: Normal range of motion.  Skin:    General: Skin is warm and dry.  Neurological:     General: No focal deficit present.     Mental Status: He is alert and oriented to person, place, and time. Mental status is at baseline.  Psychiatric:        Mood and Affect: Mood normal.        Behavior: Behavior normal.        Thought Content: Thought content normal.      UC Treatments / Results  Labs (all labs ordered are listed, but only abnormal results are displayed) Labs Reviewed - No data to display  EKG   Radiology No results found.  Procedures Procedures (including critical care time)  Medications Ordered in UC Medications - No data to display  Initial Impression / Assessment and Plan / UC Course  I have reviewed the triage vital signs and the nursing notes.  Pertinent labs & imaging results that were available during my care of the patient were reviewed by me and considered in my medical decision making (see chart for details).     MDM: 1.  Chest pain, unspecified type-EKG revealed atrial fibrillation similar in appearance to previous EKG of 03/16/2024; 2.  Permanent atrial fibrillation-as revealed on EKG today patient is currently on apixaban  and denies any unusual bleeding. Advised patient if chest pain worsens and/or unresolved please follow-up with your cardiologist go to [facility] ED. patient discharged home, hemodynamically stable. Final Clinical Impressions(s) / UC Diagnoses   Final diagnoses:  Permanent atrial fibrillation (HCC)  Chest pain, unspecified type     Discharge Instructions      Advised patient if chest pain worsens and/or unresolved please follow-up with your cardiologist go to [facility] ED.      ED  Prescriptions   None    PDMP not reviewed this encounter.   Teddy Sharper, FNP 06/13/24 1433

## 2024-06-13 NOTE — Telephone Encounter (Signed)
 FYI Only or Action Required?: FYI only for provider.  Patient was last seen in primary care on 12/10/2023 by Alvia Bring, DO.  Called Nurse Triage reporting Back Pain.  Symptoms began a week ago.  Interventions attempted: Prescription medications: Tylenol with Codeine.  Symptoms are: unchanged.  Triage Disposition: See PCP When Office is Open (Within 3 Days)  Patient/caregiver understands and will follow disposition?: Yes      Copied from CRM [id number]. Topic: Clinical - Red Word Triage >> Jun 13, 2024  9:35 AM Winona R wrote: Pt has extreme back pain, wife on the line. It has gotten worst over the last week. Pt wife has given him tylenol with codeine which was left over from her knee issues. It has knocked the sharp pain off but pt is still uncomfortable if moving a specific way Reason for Disposition  [1] MODERATE back pain (e.g., interferes with normal activities) AND [2] present > 3 days  Answer Assessment - Initial Assessment Questions 1. ONSET: When did the pain begin? (e.g., minutes, hours, days)     1 week ago 2. LOCATION: Where does it hurt? (upper, mid or lower back)     Left lower/mid back  3. SEVERITY: How bad is the pain?  (e.g., Scale 1-10; mild, moderate, or severe)     Moderate to severe  4. PATTERN: Is the pain constant? (e.g., yes, no; constant, intermittent)      Constant  5. RADIATION: Does the pain shoot into your legs or somewhere else?     No 6. CAUSE:  What do you think is causing the back pain?      Unsure if due to overuse  7. BACK OVERUSE:  Any recent lifting of heavy objects, strenuous work or exercise?     Yes 8. MEDICINES: What have you taken so far for the pain? (e.g., nothing, acetaminophen, NSAIDS)     Tylenol with Codeine  9. NEUROLOGIC SYMPTOMS: Do you have any weakness, numbness, or problems with bowel/bladder control?     No 10. OTHER SYMPTOMS: Do you have any other symptoms? (e.g., fever, abdomen pain, burning  with urination, blood in urine)       No  Protocols used: Back Pain-A-AH

## 2024-06-13 NOTE — Discharge Instructions (Addendum)
 Advised patient if chest pain worsens and/or unresolved please follow-up with your cardiologist go to [facility] med [facility] ED.

## 2024-06-13 NOTE — ED Triage Notes (Signed)
 Pt presents to uc with 1 hr of chest pain mid epigastric radiating around the left side pt reports pain waxes and waynes.

## 2024-06-14 ENCOUNTER — Ambulatory Visit (INDEPENDENT_AMBULATORY_CARE_PROVIDER_SITE_OTHER): Admitting: Family Medicine

## 2024-06-14 ENCOUNTER — Ambulatory Visit

## 2024-06-14 ENCOUNTER — Encounter: Payer: Self-pay | Admitting: Family Medicine

## 2024-06-14 VITALS — BP 122/79 | HR 78 | Ht 73.5 in | Wt 219.0 lb

## 2024-06-14 DIAGNOSIS — Z125 Encounter for screening for malignant neoplasm of prostate: Secondary | ICD-10-CM | POA: Diagnosis not present

## 2024-06-14 DIAGNOSIS — M545 Low back pain, unspecified: Secondary | ICD-10-CM | POA: Insufficient documentation

## 2024-06-14 DIAGNOSIS — D6869 Other thrombophilia: Secondary | ICD-10-CM

## 2024-06-14 DIAGNOSIS — I4821 Permanent atrial fibrillation: Secondary | ICD-10-CM

## 2024-06-14 DIAGNOSIS — Z13220 Encounter for screening for lipoid disorders: Secondary | ICD-10-CM | POA: Diagnosis not present

## 2024-06-14 DIAGNOSIS — I10 Essential (primary) hypertension: Secondary | ICD-10-CM

## 2024-06-14 MED ORDER — PREDNISONE 10 MG (21) PO TBPK: ORAL_TABLET | ORAL | 0 refills | Status: AC

## 2024-06-14 NOTE — Assessment & Plan Note (Signed)
 PSA ordered

## 2024-06-14 NOTE — Assessment & Plan Note (Signed)
 Follow-up with cardiology.  Rate controlled with metoprolol .

## 2024-06-14 NOTE — Progress Notes (Signed)
 Adam Parrish - 78 y.o. male MRN [id number]  Date of birth: 05/06/1946  Subjective Chief Complaint  Patient presents with   Back Pain    HPI Adam Parrish is a 78 y.o. male here today with complaint of back pain.  Symptoms started about a week ago.  Pain located in the left lower area of the back.  He tried one of his wife's tylenol #3 with some relief but didn't like the side effects from this.  He has been replacing windows on his house climbing up and down ladders quite a bit recently.  He denies radiation of pain, numbness and tingling.  No urinary symptoms.  He did have some constipation after taking Tylenol 3 however this has improved.  ROS:  A comprehensive ROS was completed and negative except as noted per HPI  Allergies  Allergen Reactions   Shellfish Allergy Nausea And Vomiting, Swelling and Rash    Past Medical History:  Diagnosis Date   Atrial fibrillation (HCC)    Chronic    Chronic anticoagulation    followed [facility]   Erectile dysfunction     Past Surgical History:  Procedure Laterality Date   CARDIOVASCULAR STRESS TEST  10-15-2005   EF 54%   CIRCUMCISION REVISION  1986   US  ECHOCARDIOGRAPHY  07-24-2004   EF 55-60%   VASECTOMY REVERSAL  1986    Social History   Socioeconomic History   Marital status: Married    Spouse name: Michaeal Davis   Number of children: 7   Years of education: 14   Highest education level: Associate degree: occupational, scientist, product/process development, or vocational program  Occupational History   Occupation: Self-Employed   Occupation: working full time  Tobacco Use   Smoking status: Former    Current packs/day: 0.00    Average packs/day: 1 pack/day for 20.0 years (20.0 ttl pk-yrs)    Types: Cigarettes    Start date: 08/19/1955    Quit date: 08/19/1975    Years since quitting: 48.8   Smokeless tobacco: Never  Vaping Use   Vaping status: Never Used  Substance and Sexual Activity   Alcohol use: Yes    Alcohol/week: 7.0 standard drinks  of alcohol    Types: 7 Cans of beer per week    Comment: Daily   Drug use: Never   Sexual activity: Yes    Partners: Female  Other Topics Concern   Not on file  Social History Narrative   Lives with spouse. He has seven children. He enjoys hunting, fishing and golfing.   Social Drivers of Corporate Investment Banker Strain: Low Risk  (02/09/2024)   Overall Financial Resource Strain (CARDIA)    Difficulty of Paying Living Expenses: Not hard at all  Food Insecurity: No Food Insecurity (02/09/2024)   Hunger Vital Sign    Worried About Running Out of Food in the Last Year: Never true    Ran Out of Food in the Last Year: Never true  Transportation Needs: No Transportation Needs (02/09/2024)   PRAPARE - Administrator, Civil Service (Medical): No    Lack of Transportation (Non-Medical): No  Physical Activity: Sufficiently Active (02/09/2024)   Exercise Vital Sign    Days of Exercise per Week: 3 days    Minutes of Exercise per Session: 60 min  Stress: No Stress Concern Present (02/09/2024)   [employer] of Occupational Health - Occupational Stress Questionnaire    Feeling of Stress: Not at all  Social Connections:  Socially Integrated (02/09/2024)   Social Connection and Isolation Panel    Frequency of Communication with Friends and Family: More than three times a week    Frequency of Social Gatherings with Friends and Family: More than three times a week    Attends Religious Services: More than 4 times per year    Active Member of Clubs or Organizations: Yes    Attends Engineer, Structural: More than 4 times per year    Marital Status: Married    Family History  Problem Relation Age of Onset   Alzheimer's disease Mother    Prostate cancer Father     Health Maintenance  Topic Date Due   Zoster Vaccines- Shingrix (1 of 2) 09/14/2024 (Originally 01/07/1965)   Influenza Vaccine  11/15/2024 (Originally 03/18/2024)   COVID-19 Vaccine (2 - Pfizer risk series)  06/30/2025 (Originally 05/09/2020)   Medicare Annual Wellness (AWV)  02/08/2025   DTaP/Tdap/Td (3 - Td or Tdap) 07/16/2026   Pneumococcal Vaccine: 50+ Years  Completed   Hepatitis C Screening  Completed   Meningococcal B Vaccine  Aged Out     ----------------------------------------------------------------------------------------------------------------------------------------------------------------------------------------------------------------- Physical Exam BP 122/79 (BP Location: Left Arm, Patient Position: Sitting, Cuff Size: Normal)   Pulse 78   Ht 6' 1.5 (1.867 m)   Wt 219 lb (99.3 kg)   SpO2 97%   BMI 28.50 kg/m   Physical Exam Constitutional:      Appearance: Normal appearance.  Eyes:     General: No scleral icterus. Cardiovascular:     Rate and Rhythm: Normal rate and regular rhythm.  Pulmonary:     Effort: Pulmonary effort is normal.     Breath sounds: Normal breath sounds.  Musculoskeletal:     Cervical back: Neck supple.  Neurological:     Mental Status: He is alert.  Psychiatric:        Mood and Affect: Mood normal.        Behavior: Behavior normal.     ------------------------------------------------------------------------------------------------------------------------------------------------------------------------------------------------------------------- Assessment and Plan  Atrial fibrillation [facility]) Follow-up with cardiology.  Rate controlled with metoprolol .  Secondary hypercoagulable state Remains on eliquis  for anticoagulation.  Overall tolerating well.  Updating labs today.  Acute left-sided low back pain without sciatica Discussed conservative treatment.  Updated x-rays ordered of the lumbar spine.  Adding prednisone  taper.  Will avoid NSAIDs due to anticoagulant use.  Given handout for home exercises.  He will let me know if not improving.  Screening for lipid disorders Update lipid panel  Screening for malignant neoplasm of  prostate PSA ordered.   Meds ordered this encounter  Medications   predniSONE  (STERAPRED UNI-PAK 21 TAB) 10 MG (21) TBPK tablet    Sig: Taper as directed on packaging.    Dispense:  21 tablet    Refill:  0    No follow-ups on file.

## 2024-06-14 NOTE — Assessment & Plan Note (Signed)
 Discussed conservative treatment.  Updated x-rays ordered of the lumbar spine.  Adding prednisone  taper.  Will avoid NSAIDs due to anticoagulant use.  Given handout for home exercises.  He will let me know if not improving.

## 2024-06-14 NOTE — Assessment & Plan Note (Signed)
 Remains on eliquis  for anticoagulation.  Overall tolerating well.  Updating labs today.

## 2024-06-14 NOTE — Assessment & Plan Note (Signed)
 Update lipid panel.

## 2024-06-15 LAB — CBC WITH DIFFERENTIAL/PLATELET
Basophils Absolute: 0 x10E3/uL (ref 0.0–0.2)
Basos: 0 %
EOS (ABSOLUTE): 0.1 x10E3/uL (ref 0.0–0.4)
Eos: 2 %
Hematocrit: 46.4 % (ref 37.5–51.0)
Hemoglobin: 15 g/dL (ref 13.0–17.7)
Immature Grans (Abs): 0.1 x10E3/uL (ref 0.0–0.1)
Immature Granulocytes: 1 %
Lymphocytes Absolute: 3 x10E3/uL (ref 0.7–3.1)
Lymphs: 31 %
MCH: 31.3 pg (ref 26.6–33.0)
MCHC: 32.3 g/dL (ref 31.5–35.7)
MCV: 97 fL (ref 79–97)
Monocytes Absolute: 1.1 x10E3/uL — ABNORMAL HIGH (ref 0.1–0.9)
Monocytes: 11 %
Neutrophils Absolute: 5.3 x10E3/uL (ref 1.4–7.0)
Neutrophils: 55 %
Platelets: 258 x10E3/uL (ref 150–450)
RBC: 4.8 x10E6/uL (ref 4.14–5.80)
RDW: 13.4 % (ref 11.6–15.4)
WBC: 9.6 x10E3/uL (ref 3.4–10.8)

## 2024-06-15 LAB — CMP14+EGFR
ALT: 33 IU/L (ref 0–44)
AST: 18 IU/L (ref 0–40)
Albumin: 4.4 g/dL (ref 3.8–4.8)
Alkaline Phosphatase: 73 IU/L (ref 47–123)
BUN/Creatinine Ratio: 18 (ref 10–24)
BUN: 20 mg/dL (ref 8–27)
Bilirubin Total: 1.2 mg/dL (ref 0.0–1.2)
CO2: 23 mmol/L (ref 20–29)
Calcium: 9.7 mg/dL (ref 8.6–10.2)
Chloride: 99 mmol/L (ref 96–106)
Creatinine, Ser: 1.1 mg/dL (ref 0.76–1.27)
Globulin, Total: 2.2 g/dL (ref 1.5–4.5)
Glucose: 90 mg/dL (ref 70–99)
Potassium: 5.4 mmol/L — ABNORMAL HIGH (ref 3.5–5.2)
Sodium: 138 mmol/L (ref 134–144)
Total Protein: 6.6 g/dL (ref 6.0–8.5)
eGFR: 69 mL/min/1.73 (ref >59)

## 2024-06-15 LAB — LIPID PANEL WITH LDL/HDL RATIO
Cholesterol, Total: 174 mg/dL (ref 100–199)
HDL: 60 mg/dL (ref >39)
LDL Chol Calc (NIH): 102 mg/dL — ABNORMAL HIGH (ref 0–99)
LDL/HDL Ratio: 1.7 ratio (ref 0.0–3.6)
Triglycerides: 65 mg/dL (ref 0–149)
VLDL Cholesterol Cal: 12 mg/dL (ref 5–40)

## 2024-06-15 LAB — PSA: Prostate Specific Ag, Serum: 0.2 ng/mL (ref 0.0–4.0)

## 2024-06-15 LAB — TSH: TSH: 2.75 u[IU]/mL (ref 0.450–4.500)

## 2024-06-16 ENCOUNTER — Ambulatory Visit: Payer: Self-pay | Admitting: Family Medicine

## 2024-06-21 ENCOUNTER — Telehealth: Payer: Self-pay

## 2024-06-21 NOTE — Telephone Encounter (Signed)
 Copied from CRM [id number]. Topic: Clinical - Lab/Test Results >> Jun 21, 2024  8:25 AM Emylou G wrote: Reason for CRM: Pls call patient - would like his lab results

## 2024-06-27 NOTE — Telephone Encounter (Addendum)
 Referral sent to [facility] on 06/01/2024. Patient was seen on 06/06/2024 by Dr. Lamar Sauers.

## 2024-06-28 ENCOUNTER — Other Ambulatory Visit: Payer: Self-pay | Admitting: Cardiovascular Disease

## 2024-06-30 MED ORDER — METOPROLOL TARTRATE 50 MG PO TABS: 50.0000 mg | ORAL_TABLET | Freq: Two times a day (BID) | ORAL | 2 refills | Status: AC

## 2025-02-09 ENCOUNTER — Ambulatory Visit
